# Patient Record
Sex: Female | Born: 1970 | Race: White | Hispanic: No | Smoking: Current every day smoker
Health system: Southern US, Community
[De-identification: ages and names within clinical notes are randomized; demographics above are authoritative.]

## PROBLEM LIST (undated history)

## (undated) DIAGNOSIS — J449 Chronic obstructive pulmonary disease, unspecified: Secondary | ICD-10-CM

## (undated) DIAGNOSIS — C801 Malignant (primary) neoplasm, unspecified: Secondary | ICD-10-CM

## (undated) DIAGNOSIS — F411 Generalized anxiety disorder: Secondary | ICD-10-CM

## (undated) HISTORY — PX: HERNIA REPAIR: SHX51

## (undated) HISTORY — DX: Generalized anxiety disorder: F41.1

## (undated) HISTORY — DX: Chronic obstructive pulmonary disease, unspecified: J44.9

## (undated) HISTORY — PX: BACK SURGERY: SHX140

## (undated) HISTORY — PX: TUBAL LIGATION: SHX77

---

## 2000-08-02 ENCOUNTER — Inpatient Hospital Stay (HOSPITAL_COMMUNITY): Admission: AD | Admit: 2000-08-02 | Discharge: 2000-08-02 | Payer: Self-pay | Admitting: Obstetrics

## 2000-08-02 ENCOUNTER — Encounter: Payer: Self-pay | Admitting: Obstetrics

## 2000-08-04 ENCOUNTER — Encounter: Admission: RE | Admit: 2000-08-04 | Discharge: 2000-08-04 | Payer: Self-pay | Admitting: Family Medicine

## 2000-08-05 ENCOUNTER — Inpatient Hospital Stay (HOSPITAL_COMMUNITY): Admission: AD | Admit: 2000-08-05 | Discharge: 2000-08-05 | Payer: Self-pay | Admitting: Obstetrics & Gynecology

## 2000-08-18 ENCOUNTER — Encounter: Admission: RE | Admit: 2000-08-18 | Discharge: 2000-08-18 | Payer: Self-pay | Admitting: Family Medicine

## 2000-09-01 ENCOUNTER — Encounter: Admission: RE | Admit: 2000-09-01 | Discharge: 2000-09-01 | Payer: Self-pay | Admitting: Family Medicine

## 2000-09-29 ENCOUNTER — Encounter: Admission: RE | Admit: 2000-09-29 | Discharge: 2000-09-29 | Payer: Self-pay | Admitting: Family Medicine

## 2000-10-20 ENCOUNTER — Encounter: Admission: RE | Admit: 2000-10-20 | Discharge: 2000-10-20 | Payer: Self-pay | Admitting: Family Medicine

## 2000-10-28 ENCOUNTER — Encounter: Admission: RE | Admit: 2000-10-28 | Discharge: 2000-10-28 | Payer: Self-pay | Admitting: Family Medicine

## 2000-11-02 ENCOUNTER — Ambulatory Visit (HOSPITAL_COMMUNITY): Admission: RE | Admit: 2000-11-02 | Discharge: 2000-11-02 | Payer: Self-pay | Admitting: Family Medicine

## 2000-11-17 ENCOUNTER — Encounter: Admission: RE | Admit: 2000-11-17 | Discharge: 2000-11-17 | Payer: Self-pay | Admitting: Family Medicine

## 2000-11-22 ENCOUNTER — Encounter: Admission: RE | Admit: 2000-11-22 | Discharge: 2000-11-22 | Payer: Self-pay | Admitting: Family Medicine

## 2000-12-07 ENCOUNTER — Encounter: Admission: RE | Admit: 2000-12-07 | Discharge: 2000-12-07 | Payer: Self-pay | Admitting: Family Medicine

## 2000-12-07 ENCOUNTER — Inpatient Hospital Stay (HOSPITAL_COMMUNITY): Admission: AD | Admit: 2000-12-07 | Discharge: 2000-12-11 | Payer: Self-pay | Admitting: *Deleted

## 2000-12-07 ENCOUNTER — Encounter: Payer: Self-pay | Admitting: *Deleted

## 2000-12-15 ENCOUNTER — Encounter: Admission: RE | Admit: 2000-12-15 | Discharge: 2000-12-15 | Payer: Self-pay | Admitting: Family Medicine

## 2000-12-27 ENCOUNTER — Encounter: Admission: RE | Admit: 2000-12-27 | Discharge: 2000-12-27 | Payer: Self-pay | Admitting: Family Medicine

## 2001-01-07 ENCOUNTER — Encounter: Admission: RE | Admit: 2001-01-07 | Discharge: 2001-01-07 | Payer: Self-pay | Admitting: Family Medicine

## 2001-01-07 ENCOUNTER — Inpatient Hospital Stay (HOSPITAL_COMMUNITY): Admission: AD | Admit: 2001-01-07 | Discharge: 2001-01-07 | Payer: Self-pay | Admitting: *Deleted

## 2001-01-07 ENCOUNTER — Encounter: Payer: Self-pay | Admitting: *Deleted

## 2001-01-14 ENCOUNTER — Encounter: Admission: RE | Admit: 2001-01-14 | Discharge: 2001-01-14 | Payer: Self-pay | Admitting: Family Medicine

## 2001-01-19 ENCOUNTER — Encounter: Admission: RE | Admit: 2001-01-19 | Discharge: 2001-01-19 | Payer: Self-pay | Admitting: Family Medicine

## 2001-01-31 ENCOUNTER — Encounter: Admission: RE | Admit: 2001-01-31 | Discharge: 2001-01-31 | Payer: Self-pay | Admitting: Pediatrics

## 2001-02-10 ENCOUNTER — Encounter: Admission: RE | Admit: 2001-02-10 | Discharge: 2001-02-10 | Payer: Self-pay | Admitting: Family Medicine

## 2001-02-10 ENCOUNTER — Encounter: Payer: Self-pay | Admitting: Obstetrics & Gynecology

## 2001-02-10 ENCOUNTER — Inpatient Hospital Stay (HOSPITAL_COMMUNITY): Admission: AD | Admit: 2001-02-10 | Discharge: 2001-02-10 | Payer: Self-pay | Admitting: Obstetrics & Gynecology

## 2001-02-15 ENCOUNTER — Encounter: Admission: RE | Admit: 2001-02-15 | Discharge: 2001-02-15 | Payer: Self-pay | Admitting: Family Medicine

## 2001-02-22 ENCOUNTER — Encounter: Admission: RE | Admit: 2001-02-22 | Discharge: 2001-02-22 | Payer: Self-pay | Admitting: Family Medicine

## 2001-03-01 ENCOUNTER — Encounter: Admission: RE | Admit: 2001-03-01 | Discharge: 2001-03-01 | Payer: Self-pay | Admitting: Family Medicine

## 2001-03-08 ENCOUNTER — Inpatient Hospital Stay (HOSPITAL_COMMUNITY): Admission: AD | Admit: 2001-03-08 | Discharge: 2001-03-08 | Payer: Self-pay | Admitting: *Deleted

## 2001-03-10 ENCOUNTER — Inpatient Hospital Stay (HOSPITAL_COMMUNITY): Admission: AD | Admit: 2001-03-10 | Discharge: 2001-03-14 | Payer: Self-pay | Admitting: Obstetrics & Gynecology

## 2001-04-19 ENCOUNTER — Inpatient Hospital Stay (HOSPITAL_COMMUNITY): Admission: AD | Admit: 2001-04-19 | Discharge: 2001-04-19 | Payer: Self-pay | Admitting: *Deleted

## 2001-04-25 ENCOUNTER — Ambulatory Visit (HOSPITAL_COMMUNITY): Admission: RE | Admit: 2001-04-25 | Discharge: 2001-04-25 | Payer: Self-pay | Admitting: Obstetrics & Gynecology

## 2001-05-01 ENCOUNTER — Inpatient Hospital Stay (HOSPITAL_COMMUNITY): Admission: AD | Admit: 2001-05-01 | Discharge: 2001-05-01 | Payer: Self-pay | Admitting: Obstetrics & Gynecology

## 2005-04-28 ENCOUNTER — Other Ambulatory Visit: Admission: RE | Admit: 2005-04-28 | Discharge: 2005-04-28 | Payer: Self-pay | Admitting: Family Medicine

## 2006-10-18 ENCOUNTER — Encounter: Admission: RE | Admit: 2006-10-18 | Discharge: 2006-10-18 | Payer: Self-pay | Admitting: *Deleted

## 2011-05-29 ENCOUNTER — Other Ambulatory Visit: Payer: Self-pay | Admitting: Internal Medicine

## 2011-06-01 ENCOUNTER — Other Ambulatory Visit: Payer: Self-pay | Admitting: Internal Medicine

## 2012-11-12 ENCOUNTER — Other Ambulatory Visit: Payer: Self-pay | Admitting: Nurse Practitioner

## 2012-11-14 NOTE — Telephone Encounter (Signed)
Rx sent to pharmacy   

## 2012-11-14 NOTE — Telephone Encounter (Signed)
MMM SENT RX TO PHARMACY

## 2012-11-14 NOTE — Telephone Encounter (Signed)
PLEASE ADVISE.

## 2012-11-15 ENCOUNTER — Telehealth: Payer: Self-pay | Admitting: Nurse Practitioner

## 2012-11-22 NOTE — Telephone Encounter (Signed)
lmtcb on 4/17- pt never returned call- pt may call if anything/appt still needed.

## 2012-12-06 ENCOUNTER — Telehealth: Payer: Self-pay | Admitting: Nurse Practitioner

## 2012-12-06 NOTE — Telephone Encounter (Signed)
Nursing pe scheduled for 5/29 with mmm

## 2012-12-29 ENCOUNTER — Encounter: Payer: Self-pay | Admitting: Nurse Practitioner

## 2012-12-29 ENCOUNTER — Ambulatory Visit (INDEPENDENT_AMBULATORY_CARE_PROVIDER_SITE_OTHER): Payer: PRIVATE HEALTH INSURANCE | Admitting: Nurse Practitioner

## 2012-12-29 VITALS — BP 113/79 | HR 89 | Temp 98.0°F | Ht 65.0 in | Wt 218.0 lb

## 2012-12-29 DIAGNOSIS — Z23 Encounter for immunization: Secondary | ICD-10-CM

## 2012-12-29 DIAGNOSIS — Z0000 Encounter for general adult medical examination without abnormal findings: Secondary | ICD-10-CM

## 2012-12-29 NOTE — Patient Instructions (Signed)

## 2012-12-29 NOTE — Progress Notes (Addendum)
  Subjective:    Patient ID: Lynn Golden, female    DOB: September 25, 1970, 42 y.o.   MRN: [id number]  HPI- Patient in today for a complete physical exam. 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 Her only medical problems are mild depression in which she takes lexapro for and COPD in which sh eis on symbicort and albuterol. She has decreased her amount of cigerette smoking to 5 a day. Unable to smoke at work which has really helped her cut down. She has no complaints today.    Review of Systems  Constitutional: Negative.   HENT: Negative.   Eyes: Negative.   Respiratory: Negative.   Cardiovascular: Negative.   Gastrointestinal: Negative.   Endocrine: Negative.   Genitourinary: Negative.   Musculoskeletal: Negative.   Allergic/Immunologic: Negative.   Neurological: Negative.   Hematological: Negative.   Psychiatric/Behavioral: Negative.        Objective:   Physical Exam  Constitutional: She is oriented to person, place, and time. She appears well-developed and well-nourished.  HENT:  Nose: Nose normal.  Mouth/Throat: Oropharynx is clear and moist.  Eyes: EOM are normal.  Neck: Trachea normal, normal range of motion and full passive range of motion without pain. Neck supple. No JVD present. Carotid bruit is not present. No thyromegaly present.  Cardiovascular: Normal rate, regular rhythm, normal heart sounds and intact distal pulses.  Exam reveals no gallop and no friction rub.   No murmur heard. Pulmonary/Chest: Effort normal and breath sounds normal.  Abdominal: Soft. Bowel sounds are normal. She exhibits no distension and no mass. There is no tenderness.  Musculoskeletal: Normal range of motion.  Lymphadenopathy:    She has no cervical adenopathy.  Neurological: She is alert and oriented to person, place, and time. She has normal reflexes.  Skin: Skin is warm and dry.  Psychiatric: She has a normal mood and affect. Her behavior is normal. Judgment and thought  content normal.    BP 113/79  Pulse 89  Temp(Src) 98 F (36.7 C) (Oral)  Ht 5\' 5"  (1.651 m)  Wt 218 lb (98.884 kg)  BMI 36.28 kg/m2  LMP 12/16/2012       Assessment & Plan:  1. Need for Tdap vaccination - Tdap vaccine greater than or equal to 7yo IM  2. Annual physical exam Low fat diet and exercise Labs pending - Measles/Mumps/Rubella Immunity - Varicella zoster antibody, IgG   Mary-Margaret Daphine Deutscher, FNP

## 2012-12-30 LAB — MEASLES/MUMPS/RUBELLA IMMUNITY
Mumps IgG: 45.9 AU/mL — ABNORMAL HIGH (ref <9.00)
Rubella: 10.7 Index — ABNORMAL HIGH (ref <0.90)

## 2012-12-30 NOTE — Progress Notes (Deleted)
  Subjective:    Patient ID: Lynn Golden, female    DOB: 01/19/1971, 42 y.o.   MRN: [id number]  HPI    Review of Systems     Objective:   Physical Exam        Assessment & Plan:

## 2013-01-03 ENCOUNTER — Telehealth: Payer: Self-pay | Admitting: Nurse Practitioner

## 2013-01-03 NOTE — Telephone Encounter (Signed)
Patient aware of labs.  

## 2013-02-22 ENCOUNTER — Other Ambulatory Visit: Payer: Self-pay | Admitting: General Practice

## 2013-02-22 ENCOUNTER — Ambulatory Visit (INDEPENDENT_AMBULATORY_CARE_PROVIDER_SITE_OTHER): Payer: PRIVATE HEALTH INSURANCE | Admitting: General Practice

## 2013-02-22 ENCOUNTER — Encounter: Payer: Self-pay | Admitting: General Practice

## 2013-02-22 VITALS — BP 124/87 | HR 99 | Temp 98.6°F | Ht 66.0 in | Wt 209.0 lb

## 2013-02-22 DIAGNOSIS — N6320 Unspecified lump in the left breast, unspecified quadrant: Secondary | ICD-10-CM

## 2013-02-22 DIAGNOSIS — N63 Unspecified lump in unspecified breast: Secondary | ICD-10-CM

## 2013-02-22 DIAGNOSIS — N6310 Unspecified lump in the right breast, unspecified quadrant: Secondary | ICD-10-CM

## 2013-02-22 NOTE — Patient Instructions (Signed)

## 2013-02-22 NOTE — Progress Notes (Signed)
  Subjective:    Patient ID: Lynn Golden, female    DOB: 02-12-1971, 42 y.o.   MRN: [id number]  HPI Patient presents today with complaints of left breast mass. She reports noticing lump on Friday night. She denies drainage from any area of breast. She reports area being slightly tender. She has a history of mid chest mass.     Review of Systems  Constitutional: Negative for fever and chills.  Respiratory: Negative for chest tightness and shortness of breath.   Cardiovascular: Negative for chest pain and palpitations.       Objective:   Physical Exam  Constitutional: She is oriented to person, place, and time. She appears well-developed and well-nourished.  HENT:  Head: Normocephalic and atraumatic.  Right Ear: External ear normal.  Left Ear: External ear normal.  Nose: Nose normal.  Mouth/Throat: Oropharynx is clear and moist.  Cardiovascular: Normal rate, regular rhythm, normal heart sounds and intact distal pulses.   Pulmonary/Chest: Effort normal and breath sounds normal. No respiratory distress. She exhibits no tenderness. Right breast exhibits no inverted nipple, no mass, no nipple discharge, no skin change and no tenderness. Left breast exhibits mass and tenderness. Left breast exhibits no inverted nipple, no nipple discharge and no skin change. Breasts are symmetrical.  Neurological: She is alert and oriented to person, place, and time.  Skin: Skin is warm and dry.  Psychiatric: She has a normal mood and affect.          Assessment & Plan:  1. Left breast mass - MM Digital Diagnostic Bilat; Future -RTO if symptoms worsen or seek medical attention  -Keep appointment for mammogram once scheduled -Patient verbalized understanding -Coralie Keens, FNP-C

## 2013-03-02 ENCOUNTER — Ambulatory Visit
Admission: RE | Admit: 2013-03-02 | Discharge: 2013-03-02 | Disposition: A | Discharge status: Home / Self Care | Payer: PRIVATE HEALTH INSURANCE | Source: Ambulatory Visit | Attending: General Practice | Admitting: General Practice

## 2013-03-02 ENCOUNTER — Other Ambulatory Visit: Payer: Self-pay | Admitting: General Practice

## 2013-03-02 DIAGNOSIS — N6310 Unspecified lump in the right breast, unspecified quadrant: Secondary | ICD-10-CM

## 2013-03-02 DIAGNOSIS — N6320 Unspecified lump in the left breast, unspecified quadrant: Secondary | ICD-10-CM

## 2013-05-31 ENCOUNTER — Telehealth: Payer: Self-pay | Admitting: Nurse Practitioner

## 2013-05-31 NOTE — Telephone Encounter (Signed)
appt given for tomorrow for back pain

## 2013-06-01 ENCOUNTER — Encounter: Payer: Self-pay | Admitting: Nurse Practitioner

## 2013-06-01 ENCOUNTER — Ambulatory Visit (INDEPENDENT_AMBULATORY_CARE_PROVIDER_SITE_OTHER): Payer: PRIVATE HEALTH INSURANCE | Admitting: Nurse Practitioner

## 2013-06-01 ENCOUNTER — Ambulatory Visit (INDEPENDENT_AMBULATORY_CARE_PROVIDER_SITE_OTHER): Payer: PRIVATE HEALTH INSURANCE

## 2013-06-01 ENCOUNTER — Ambulatory Visit (HOSPITAL_COMMUNITY)
Admission: RE | Admit: 2013-06-01 | Discharge: 2013-06-01 | Disposition: A | Discharge status: Home / Self Care | Payer: PRIVATE HEALTH INSURANCE | Source: Ambulatory Visit | Attending: Nurse Practitioner | Admitting: Nurse Practitioner

## 2013-06-01 VITALS — BP 129/85 | HR 121 | Temp 98.1°F | Ht 66.0 in | Wt 210.0 lb

## 2013-06-01 DIAGNOSIS — F329 Major depressive disorder, single episode, unspecified: Secondary | ICD-10-CM | POA: Insufficient documentation

## 2013-06-01 DIAGNOSIS — N949 Unspecified condition associated with female genital organs and menstrual cycle: Secondary | ICD-10-CM | POA: Insufficient documentation

## 2013-06-01 DIAGNOSIS — IMO0001 Reserved for inherently not codable concepts without codable children: Secondary | ICD-10-CM | POA: Insufficient documentation

## 2013-06-01 DIAGNOSIS — J209 Acute bronchitis, unspecified: Secondary | ICD-10-CM | POA: Insufficient documentation

## 2013-06-01 DIAGNOSIS — R1032 Left lower quadrant pain: Secondary | ICD-10-CM

## 2013-06-01 DIAGNOSIS — M51379 Other intervertebral disc degeneration, lumbosacral region without mention of lumbar back pain or lower extremity pain: Secondary | ICD-10-CM | POA: Insufficient documentation

## 2013-06-01 DIAGNOSIS — D259 Leiomyoma of uterus, unspecified: Secondary | ICD-10-CM | POA: Insufficient documentation

## 2013-06-01 DIAGNOSIS — R3989 Other symptoms and signs involving the genitourinary system: Secondary | ICD-10-CM

## 2013-06-01 DIAGNOSIS — M5137 Other intervertebral disc degeneration, lumbosacral region: Secondary | ICD-10-CM | POA: Insufficient documentation

## 2013-06-01 DIAGNOSIS — R82998 Other abnormal findings in urine: Secondary | ICD-10-CM

## 2013-06-01 LAB — POCT CBC
Granulocyte percent: 62.9 %G (ref 37–80)
Lymph, poc: 1.5 (ref 0.6–3.4)
MCH, POC: 25.2 pg — AB (ref 27–31.2)
MCV: 80.6 fL (ref 80–97)
MPV: 7.4 fL (ref 0–99.8)
POC LYMPH PERCENT: 27 %L (ref 10–50)
Platelet Count, POC: 390 10*3/uL (ref 142–424)
RDW, POC: 17 %
WBC: 5.5 10*3/uL (ref 4.6–10.2)

## 2013-06-01 LAB — POCT URINALYSIS DIPSTICK
Bilirubin, UA: NEGATIVE
Blood, UA: NEGATIVE
Glucose, UA: NEGATIVE
Spec Grav, UA: 1.025
Urobilinogen, UA: NEGATIVE

## 2013-06-01 LAB — POCT UA - MICROSCOPIC ONLY: Casts, Ur, LPF, POC: NEGATIVE

## 2013-06-01 NOTE — Progress Notes (Signed)
Subjective:    Patient ID: Lynn Golden, female    DOB: [birth year], 42 y.o.   MRN: [id number]  HPI patient in c/o left lower quadrant pain- started 3 days ago- has actually gotten worse.    Review of Systems  Constitutional: Negative for fever and chills.  Respiratory: Negative.   Cardiovascular: Negative.   Gastrointestinal: Positive for abdominal pain. Negative for diarrhea and constipation.  Genitourinary: Positive for urgency, frequency and pelvic pain.  Hematological: Negative.   Psychiatric/Behavioral: Negative.        Objective:   Physical Exam  Constitutional: She appears well-developed and well-nourished.  Cardiovascular: Normal rate, regular rhythm and normal heart sounds.   Pulmonary/Chest: Effort normal and breath sounds normal.  Abdominal: Soft. Bowel sounds are normal. She exhibits no mass. There is tenderness (left lower quadrant and groin area). There is no rebound and no guarding.  Genitourinary:  No CVA tenderness Left pelvic pain on bimanual exam- no masses palpable    BP 129/85  Pulse 121  Temp(Src) 98.1 F (36.7 C) (Oral)  Ht 5\' 6"  (1.676 m)  Wt 210 lb (95.255 kg)  BMI 33.91 kg/m2  KUB- gas- but otherwise negative-Preliminary reading by Paulene Floor, FNP  [facility] Results for orders placed in visit on 06/01/13  POCT UA - MICROSCOPIC ONLY      Result Value Range   WBC, Ur, HPF, POC occ     RBC, urine, microscopic 3-7     Bacteria, U Microscopic few     Mucus, UA mod     Epithelial cells, urine per micros few     Crystals, Ur, HPF, POC neg     Casts, Ur, LPF, POC neg     Yeast, UA neg    POCT URINALYSIS DIPSTICK      Result Value Range   Color, UA yellow     Clarity, UA clear     Glucose, UA neg     Bilirubin, UA neg     Ketones, UA neg     Spec Grav, UA 1.025     Blood, UA neg     pH, UA 5.0     Protein, UA small     Urobilinogen, UA negative     Nitrite, UA neg     Leukocytes, UA Negative    POCT CBC      Result Value Range   WBC  5.5  4.6 - 10.2 K/uL   Lymph, poc 1.5  0.6 - 3.4   POC LYMPH PERCENT 27.0  10 - 50 %L   POC Granulocyte 3.5  2 - 6.9   Granulocyte percent 62.9  37 - 80 %G   RBC 4.6  4.04 - 5.48 M/uL   Hemoglobin 11.4 (*) 12.2 - 16.2 g/dL   HCT, POC 65.7 (*) 84.6 - 47.9 %   MCV 80.6  80 - 97 fL   MCH, POC 25.2 (*) 27 - 31.2 pg   MCHC 31.2 (*) 31.8 - 35.4 g/dL   RDW, POC 96.2     Platelet Count, POC 390.0  142 - 424 K/uL   MPV 7.4  0 - 99.8 fL       Assessment & Plan:   1. Abnormal urine color   2. Abdominal pain, left lower quadrant    Orders Placed This Encounter  Procedures  . DG Abd 1 View    Standing Status: Future     Number of Occurrences: 1     Standing Expiration Date: 08/01/2014  Order Specific Question:  Reason for Exam (SYMPTOM  OR DIAGNOSIS REQUIRED)    Answer:  abdmonial pain    Order Specific Question:  Is the patient pregnant?    Answer:  No    Order Specific Question:  Preferred imaging location?    Answer:  Internal  . CT Abdomen Pelvis Wo Contrast    Standing Status: Future     Number of Occurrences:      Standing Expiration Date: 09/01/2014    Order Specific Question:  Reason for Exam (SYMPTOM  OR DIAGNOSIS REQUIRED)    Answer:  left pelvic pain    Order Specific Question:  Is the patient pregnant?    Answer:  No    Order Specific Question:  Preferred imaging location?    Answer:  [facility]  . POCT UA - Microscopic Only  . POCT urinalysis dipstick  . POCT CBC   NPO until has ct scan Will talk after ct results are back Mary-Margaret Daphine Deutscher, FNP

## 2013-06-01 NOTE — Patient Instructions (Signed)
Pelvic Pain Pelvic pain is pain below the belly button and located between your hips. Acute pain may last a few hours or days. Chronic pelvic pain may last weeks and months. The cause may be different for different types of pain. The pain may be dull or sharp, mild or severe and can interfere with your daily activities. Write down and tell your caregiver:   Exactly where the pain is located.  If it comes and goes or is there all the time.  When it happens (with sex, urination, bowel movement, etc.)  If the pain is related to your menstrual period or stress. Your caregiver will take a full history and do a complete physical exam and Pap test. CAUSES   Painful menstrual periods (dysmenorrhea).  Normal ovulation (Mittelschmertz) that occurs in the middle of the menstrual cycle every month.  The pelvic organs get engorged with blood just before the menstrual period (pelvic congestive syndrome).  Scar tissue from an infection or past surgery (pelvic adhesions).  Cancer of the female pelvic organs. When there is pain with cancer, it has been there for a long time.  The lining of the uterus (endometrium) abnormally grows in places like the pelvis and on the pelvic organs (endometriosis).  A form of endometriosis with the lining of the uterus present inside of the muscle tissue of the uterus (adenomyosis).  Fibroid tumor (noncancerous) in the uterus.  Bladder problems such as infection, bladder spasms of the muscle tissue of the bladder.  Intestinal problems (irritable bowel syndrome, colitis, an ulcer or gastrointestinal infection).  Polyps of the cervix or uterus.  Pregnancy in the tube (ectopic pregnancy).  The opening of the cervix is too small for the menstrual blood to flow through it (cervical stenosis).  Physical or sexual abuse (past or present).  Musculo-skeletal problems from poor posture, problems with the vertebrae of the lower back or the uterine pelvic muscles falling  (prolapse).  Psychological problems such as depression or stress.  IUD (intrauterine device) in the uterus. DIAGNOSIS  Tests to make a diagnosis depends on the type, location, severity and what causes the pain to occur. Tests that may be needed include:  Blood tests.  Urine tests  Ultrasound.  X-rays.  CT Scan.  MRI.  Laparoscopy.  Major surgery. TREATMENT  Treatment will depend on the cause of the pain, which includes:  Prescription or over-the-counter pain medication.  Antibiotics.  Birth control pills.  Hormone treatment.  Nerve blocking injections.  Physical therapy.  Antidepressants.  Counseling with a psychiatrist or psychologist.  Minor or major surgery. HOME CARE INSTRUCTIONS   Only take over-the-counter or prescription medicines for pain, discomfort or fever as directed by your caregiver.  Follow your caregiver's advice to treat your pain.  Rest.  Avoid sexual intercourse if it causes the pain.  Apply warm or cold compresses (which ever works best) to the pain area.  Do relaxation exercises such as yoga or meditation.  Try acupuncture.  Avoid stressful situations.  Try group therapy.  If the pain is because of a stomach/intestinal upset, drink clear liquids, eat a bland light food diet until the symptoms go away. SEEK MEDICAL CARE IF:   You need stronger prescription pain medication.  You develop pain with sexual intercourse.  You have pain with urination.  You develop a temperature of 102 F (38.9 C) with the pain.  You are still in pain after 4 hours of taking prescription medication for the pain.  You need depression medication.    Your IUD is causing pain and you want it removed. SEEK IMMEDIATE MEDICAL CARE IF:  You develop very severe pain or tenderness.  You faint, have chills, severe weakness or dehydration.  You develop heavy vaginal bleeding or passing solid tissue.  You develop a temperature of 102 F (38.9 C)  with the pain.  You have blood in the urine.  You are being physically or sexually abused.  You have uncontrolled vomiting and diarrhea.  You are depressed and afraid of harming yourself or someone else. Document Released: 08/27/2004 Document Revised: 10/12/2011 Document Reviewed: 05/24/2008 ExitCare Patient Information 2013 [employer].  

## 2013-06-02 ENCOUNTER — Telehealth: Payer: Self-pay | Admitting: Nurse Practitioner

## 2013-06-05 ENCOUNTER — Other Ambulatory Visit: Payer: Self-pay | Admitting: Nurse Practitioner

## 2013-06-05 DIAGNOSIS — R1032 Left lower quadrant pain: Secondary | ICD-10-CM

## 2013-06-05 NOTE — Telephone Encounter (Signed)
I didn't get results until today- completely negative- no masses seen only uterine fibroids which should not cause that much pain- let do GI referal

## 2013-06-08 ENCOUNTER — Other Ambulatory Visit: Payer: Self-pay

## 2013-07-24 ENCOUNTER — Encounter: Payer: Self-pay | Admitting: Family Medicine

## 2013-08-14 ENCOUNTER — Telehealth: Payer: Self-pay | Admitting: Nurse Practitioner

## 2013-08-14 MED ORDER — ALBUTEROL SULFATE HFA 108 (90 BASE) MCG/ACT IN AERS
2.0000 | INHALATION_SPRAY | Freq: Four times a day (QID) | RESPIRATORY_TRACT | Status: DC | PRN
Start: 2013-08-14 — End: 2013-08-15

## 2013-08-14 NOTE — Telephone Encounter (Signed)
rx sent to pharmacy

## 2013-08-15 ENCOUNTER — Other Ambulatory Visit: Payer: Self-pay | Admitting: Nurse Practitioner

## 2013-08-15 MED ORDER — ALBUTEROL SULFATE HFA 108 (90 BASE) MCG/ACT IN AERS: 2.0000 | INHALATION_SPRAY | Freq: Four times a day (QID) | RESPIRATORY_TRACT | Status: DC | PRN

## 2014-02-21 IMAGING — CT CT ABD-PELV W/O CM
2 of 3 series · 9 of 46 positions shown, 11 images · non-contrast
Comparison: None.

CLINICAL DATA: Left lower quadrant abdominal pain. Pelvic pain.

EXAM:
CT ABDOMEN AND PELVIS WITHOUT CONTRAST
TECHNIQUE: Multidetector CT imaging of the abdomen and pelvis was performed
following the standard protocol without intravenous contrast.

[Series 4: mpr coronal (id) · coronal · 0.79mm/px · 8 of 97 slices shown, 9 images]
[im 11/97  soft-tissue]
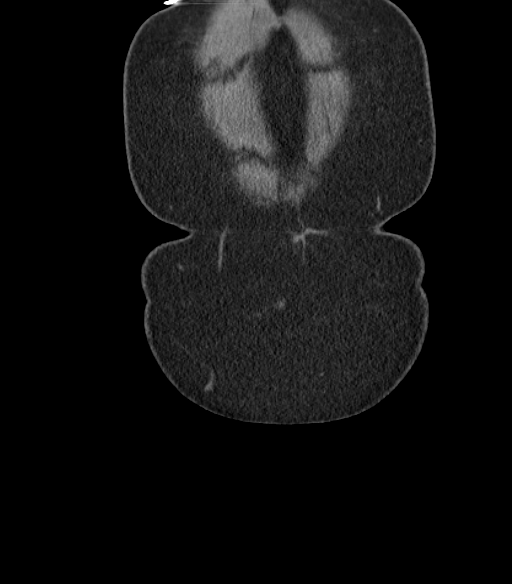
[im 11/97  bone]
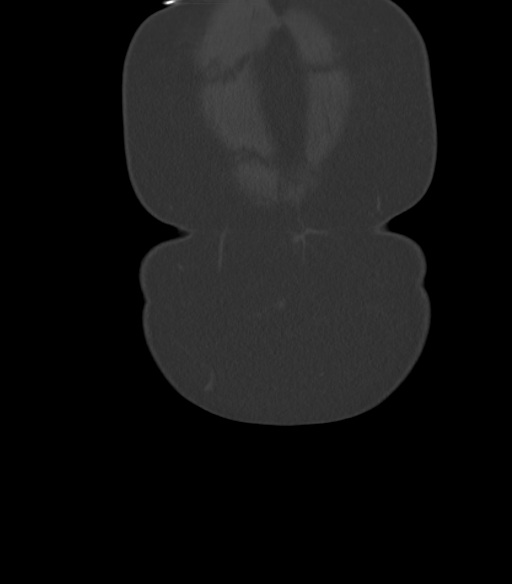
[im 22/97  soft-tissue]
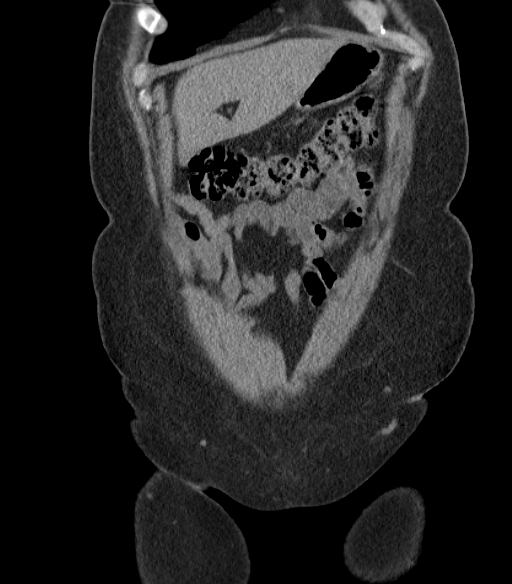
[im 33/97  soft-tissue]
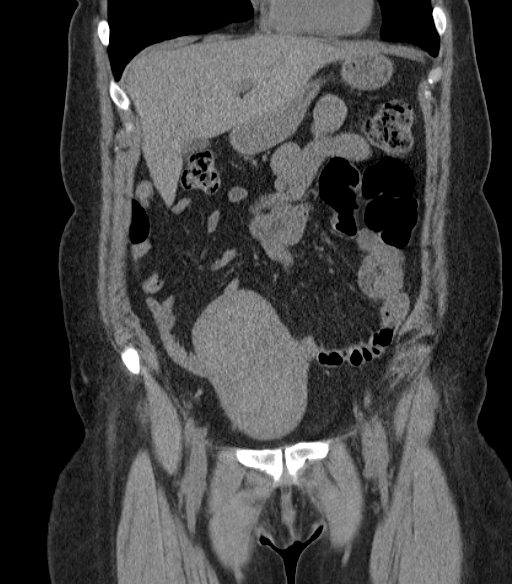
[im 43/97  soft-tissue]
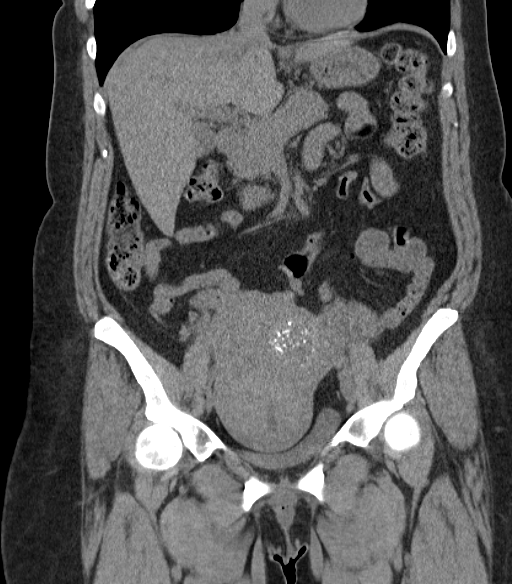
[im 54/97  soft-tissue]
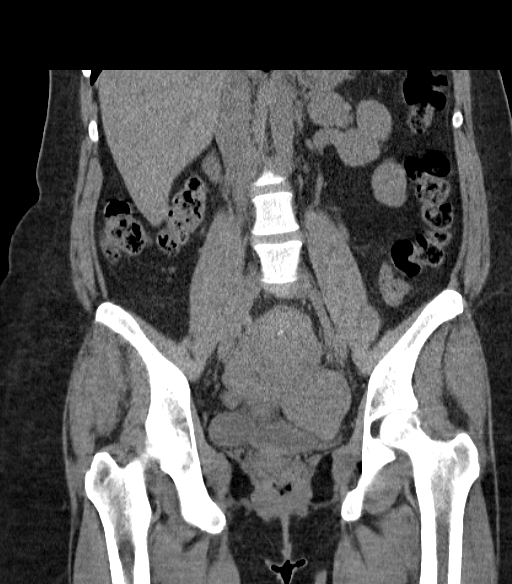
[im 65/97  soft-tissue]
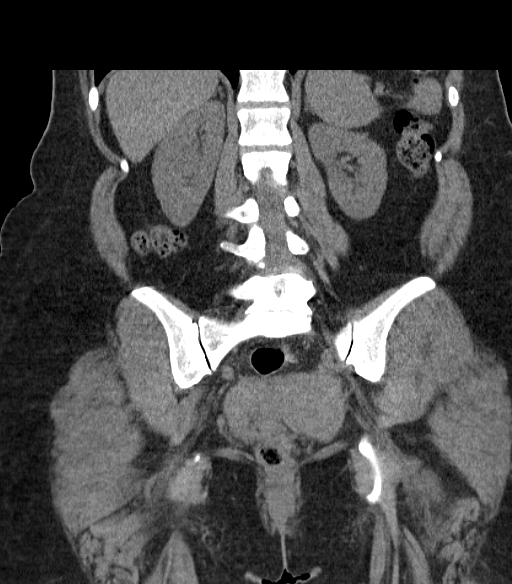
[im 75/97  soft-tissue]
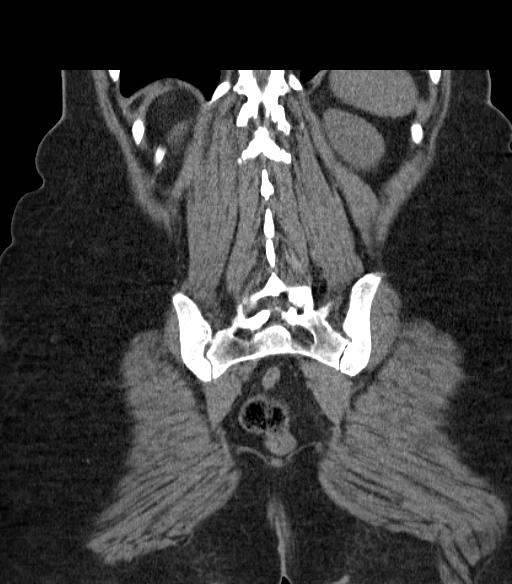
[im 86/97  soft-tissue]
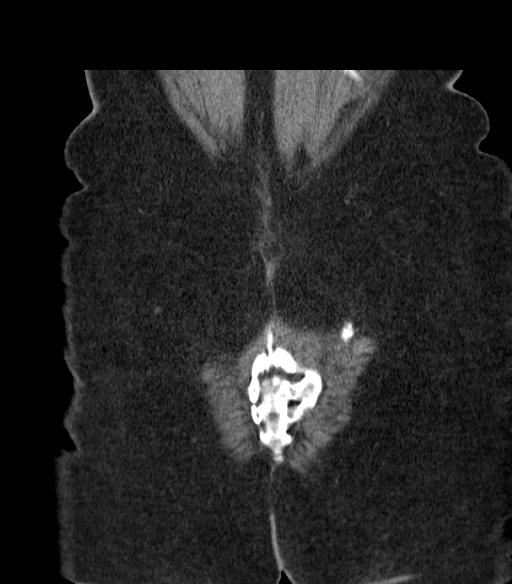

[Series 5: mpr sagittal (id) · sagittal · 0.62mm/px · 1 of 136 slices shown, 2 images]
[im 46/136  soft-tissue]
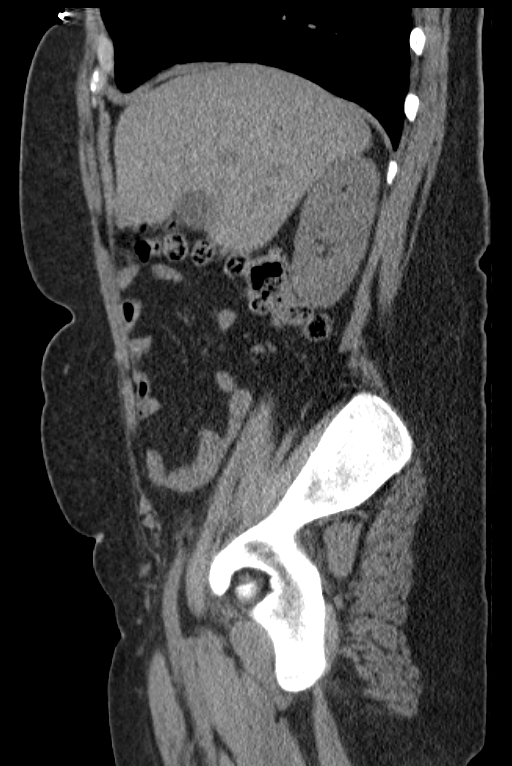
[im 46/136  bone]
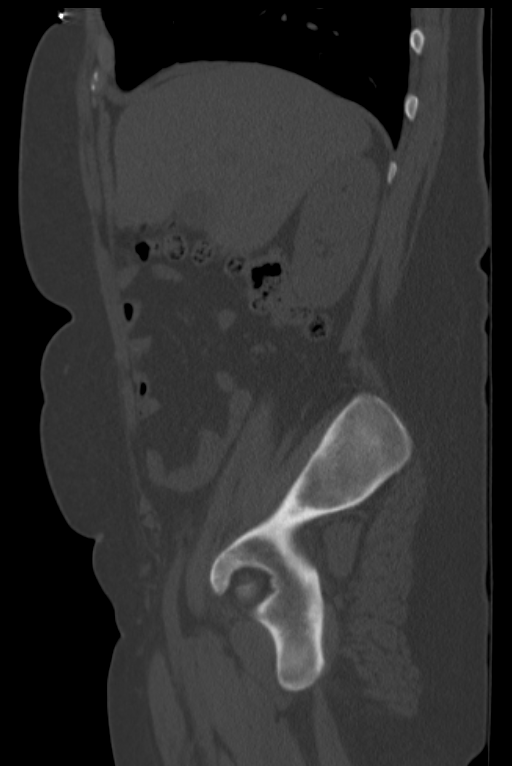

[9 of 46 positions shown; findings below may reference images not displayed]

FINDINGS: Lower Chest: Clear lung bases. Normal heart size without pericardial
or pleural effusion. A tiny hiatal hernia.

Abdomen/Pelvis: Normal an unfused appearance of the liver, spleen,
distal stomach, pancreas, gallbladder, biliary tract, adrenal
glands.

No renal calculi or hydronephrosis. No hydroureter or ureteric
calculi. Retroaortic left renal vein. No retroperitoneal or
retrocrural adenopathy.

Normal terminal ileum and appendix. Normal small bowel without
abdominal ascites. No pelvic adenopathy. Normal urinary bladder.
Multiple uterine fibroids. An anterior centrally necrotic fundal
fibroid measures 7.6 cm on image 69. Eccentric left lower uterine
segment 6.0 x 6.7 cm fibroid on image 67. Limited evaluation of the
adnexa, without definite mass. No significant free fluid.

Bones/Musculoskeletal: No acute osseous abnormality. Advanced
degenerative disc disease at the lumbosacral junction.
IMPRESSION: 1.  No urinary tract calculi or hydronephrosis.
2. Fibroid uterus.
3. No other explanation for patient's symptoms.

## 2014-03-16 ENCOUNTER — Encounter: Payer: Self-pay | Admitting: Nurse Practitioner

## 2014-03-16 ENCOUNTER — Ambulatory Visit (INDEPENDENT_AMBULATORY_CARE_PROVIDER_SITE_OTHER): Payer: PRIVATE HEALTH INSURANCE | Admitting: Nurse Practitioner

## 2014-03-16 VITALS — BP 132/83 | HR 124 | Temp 98.6°F | Ht 66.0 in | Wt 223.0 lb

## 2014-03-16 DIAGNOSIS — F411 Generalized anxiety disorder: Secondary | ICD-10-CM

## 2014-03-16 NOTE — Patient Instructions (Signed)
Stress and Stress Management Stress is a normal reaction to life events. It is what you feel when life demands more than you are used to or more than you can handle. Some stress can be useful. For example, the stress reaction can help you catch the last bus of the day, study for a test, or meet a deadline at work. But stress that occurs too often or for too long can cause problems. It can affect your emotional health and interfere with relationships and normal daily activities. Too much stress can weaken your immune system and increase your risk for physical illness. If you already have a medical problem, stress can make it worse. CAUSES  All sorts of life events may cause stress. An event that causes stress for one person may not be stressful for another person. Major life events commonly cause stress. These may be positive or negative. Examples include losing your job, moving into a new home, getting married, having a baby, or losing a loved one. Less obvious life events may also cause stress, especially if they occur day after day or in combination. Examples include working long hours, driving in traffic, caring for children, being in debt, or being in a difficult relationship. SIGNS AND SYMPTOMS Stress may cause emotional symptoms including, the following:  Anxiety. This is feeling worried, afraid, on edge, overwhelmed, or out of control.  Anger. This is feeling irritated or impatient.  Depression. This is feeling sad, down, helpless, or guilty.  Difficulty focusing, remembering, or making decisions. Stress may cause physical symptoms, including the following:   Aches and pains. These may affect your head, neck, back, stomach, or other areas of your body.  Tight muscles or clenched jaw.  Low energy or trouble sleeping. Stress may cause unhealthy behaviors, including the following:   Eating to feel better (overeating) or skipping meals.  Sleeping too little, too much, or both.  Working  too much or putting off tasks (procrastination).  Smoking, drinking alcohol, or using drugs to feel better. DIAGNOSIS  Stress is diagnosed through an assessment by your health care provider. Your health care provider will ask questions about your symptoms and any stressful life events.Your health care provider will also ask about your medical history and may order blood tests or other tests. Certain medical conditions and medicine can cause physical symptoms similar to stress. Mental illness can cause emotional symptoms and unhealthy behaviors similar to stress. Your health care provider may refer you to a mental health professional for further evaluation.  TREATMENT  Stress management is the recommended treatment for stress.The goals of stress management are reducing stressful life events and coping with stress in healthy ways.  Techniques for reducing stressful life events include the following:  Stress identification. Self-monitor for stress and identify what causes stress for you. These skills may help you to avoid some stressful events.  Time management. Set your priorities, keep a calendar of events, and learn to say "no." These tools can help you avoid making too many commitments. Techniques for coping with stress include the following:  Rethinking the problem. Try to think realistically about stressful events rather than ignoring them or overreacting. Try to find the positives in a stressful situation rather than focusing on the negatives.  Exercise. Physical exercise can release both physical and emotional tension. The key is to find a form of exercise you enjoy and do it regularly.  Relaxation techniques. These relax the body and mind. Examples include yoga, meditation, tai chi, biofeedback, deep  breathing, progressive muscle relaxation, listening to music, being out in nature, journaling, and other hobbies. Again, the key is to find one or more that you enjoy and can do  regularly.  Healthy lifestyle. Eat a balanced diet, get plenty of sleep, and do not smoke. Avoid using alcohol or drugs to relax.  Strong support network. Spend time with family, friends, or other people you enjoy being around.Express your feelings and talk things over with someone you trust. Counseling or talktherapy with a mental health professional may be helpful if you are having difficulty managing stress on your own. Medicine is typically not recommended for the treatment of stress.Talk to your health care provider if you think you need medicine for symptoms of stress. HOME CARE INSTRUCTIONS  Keep all follow-up visits as directed by your health care provider.  Take all medicines as directed by your health care provider. SEEK MEDICAL CARE IF:  Your symptoms get worse or you start having new symptoms.  You feel overwhelmed by your problems and can no longer manage them on your own. SEEK IMMEDIATE MEDICAL CARE IF:  You feel like hurting yourself or someone else. Document Released: 01/13/2001 Document Revised: 12/04/2013 Document Reviewed: 03/14/2013 ExitCare Patient Information 2015 [employer]. This information is not intended to replace advice given to you by your health care provider. Make sure you discuss any questions you have with your health care provider.  

## 2014-03-16 NOTE — Addendum Note (Signed)
Addended by: Zannie Cove on: 03/16/2014 04:35 PM   Modules accepted: Level of Service

## 2014-03-16 NOTE — Progress Notes (Signed)
   Subjective:    Patient ID: Lynn Golden, female    DOB: September 13, 1970, 43 y.o.   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 She also has been working shifts ad has to double back frequently. Patient does nit want to be on any meds- she thinks it is all stress related.    Review of Systems  Constitutional: Negative.   HENT: Negative.   Respiratory: Negative.   Cardiovascular: Negative.   Gastrointestinal: Negative.   Genitourinary: Negative.   Neurological: Negative.   Psychiatric/Behavioral: Negative.   All other systems reviewed and are negative.      Objective:   Physical Exam  Constitutional: She is oriented to person, place, and time. She appears well-developed and well-nourished.  Cardiovascular: Normal rate, regular rhythm and normal heart sounds.   Pulmonary/Chest: Effort normal and breath sounds normal.  Neurological: She is alert and oriented to person, place, and time.  Skin: Skin is warm and dry.  Psychiatric: She has a normal mood and affect. Her behavior is normal. Judgment and thought content normal.   BP 132/83  Pulse 124  Temp(Src) 98.6 F (37 C) (Oral)  Ht 5\' 6"  (1.676 m)  Wt 223 lb (101.152 kg)  BMI 36.01 kg/m2  LMP 03/10/2014         Assessment & Plan:   1. GAD (generalized anxiety disorder)    Stress management Patient does not want any meds yet Out of work till Monday RTO prn  Mary-Margaret Hassell Done, [place]

## 2014-03-24 ENCOUNTER — Ambulatory Visit (INDEPENDENT_AMBULATORY_CARE_PROVIDER_SITE_OTHER): Payer: Self-pay | Admitting: Family Medicine

## 2014-03-24 ENCOUNTER — Encounter: Payer: Self-pay | Admitting: Family Medicine

## 2014-03-24 ENCOUNTER — Encounter: Payer: Self-pay | Admitting: *Deleted

## 2014-03-24 VITALS — BP 130/84 | HR 93 | Temp 98.8°F | Ht 66.0 in | Wt 224.0 lb

## 2014-03-24 DIAGNOSIS — R05 Cough: Secondary | ICD-10-CM

## 2014-03-24 DIAGNOSIS — J4521 Mild intermittent asthma with (acute) exacerbation: Secondary | ICD-10-CM

## 2014-03-24 DIAGNOSIS — J45901 Unspecified asthma with (acute) exacerbation: Secondary | ICD-10-CM

## 2014-03-24 DIAGNOSIS — J019 Acute sinusitis, unspecified: Secondary | ICD-10-CM

## 2014-03-24 DIAGNOSIS — R062 Wheezing: Secondary | ICD-10-CM

## 2014-03-24 DIAGNOSIS — R059 Cough, unspecified: Secondary | ICD-10-CM

## 2014-03-24 MED ORDER — METHYLPREDNISOLONE ACETATE 80 MG/ML IJ SUSP
60.0000 mg | Freq: Once | INTRAMUSCULAR | Status: AC
  Administered 2014-03-24: 60 mg via INTRAMUSCULAR

## 2014-03-24 MED ORDER — AMOXICILLIN 500 MG PO CAPS: 500.0000 mg | ORAL_CAPSULE | Freq: Three times a day (TID) | ORAL | Status: DC

## 2014-03-24 MED ORDER — PREDNISONE 10 MG PO TABS: ORAL_TABLET | ORAL | Status: DC

## 2014-03-24 MED ORDER — ALBUTEROL SULFATE HFA 108 (90 BASE) MCG/ACT IN AERS: 2.0000 | INHALATION_SPRAY | Freq: Four times a day (QID) | RESPIRATORY_TRACT | Status: DC | PRN

## 2014-03-24 NOTE — Progress Notes (Signed)
Subjective:    Patient ID: Lynn Golden, female    DOB: 12-09-1970, 43 y.o.   MRN: [id number]  HPI Patient here today for chest and head congestion and cough that started Wednesday night. The patient has had a low-grade fever. The sputum and drainage so far is clear. She indicates that prior to this starting that she did mow the yard. She has had a lot of sinus congestion and headache.        Patient Active Problem List   Diagnosis Date Noted  . Depression 06/01/2013  . COPD bronchitis 06/01/2013   Outpatient Encounter Prescriptions as of 03/24/2014  Medication Sig  . albuterol (PROAIR HFA) 108 (90 BASE) MCG/ACT inhaler Inhale 2 puffs into the lungs every 6 (six) hours as needed for wheezing or shortness of breath.    Review of Systems  Constitutional: Negative.  Negative for fever.  HENT: Positive for congestion.   Eyes: Negative.   Respiratory: Positive for cough and wheezing.   Cardiovascular: Negative.   Gastrointestinal: Negative.   Endocrine: Negative.   Genitourinary: Negative.   Musculoskeletal: Negative.   Skin: Negative.   Allergic/Immunologic: Negative.   Neurological: Negative.   Hematological: Negative.   Psychiatric/Behavioral: Negative.        Objective:   Physical Exam  Nursing note and vitals reviewed. Constitutional: She is oriented to person, place, and time. She appears well-developed and well-nourished. No distress.  HENT:  Head: Normocephalic and atraumatic.  Right Ear: External ear normal.  Left Ear: External ear normal.  Mouth/Throat: Oropharynx is clear and moist. No oropharyngeal exudate.  Nasal congestion and turbinate swelling bilaterally left greater than right  Eyes: Conjunctivae and EOM are normal. Pupils are equal, round, and reactive to light. Right eye exhibits no discharge. Left eye exhibits no discharge. Scleral icterus is present.  Neck: Normal range of motion. Neck supple. No thyromegaly present.  Cardiovascular: Normal  rate, regular rhythm and normal heart sounds.  Exam reveals no gallop and no friction rub.   No murmur heard. At 72 per minute  Pulmonary/Chest: Effort normal. No respiratory distress. She has wheezes. She has no rales.  The breath sounds are distant and there is wheezing bilaterally and congestion with coughing  Musculoskeletal: Normal range of motion.  Neurological: She is alert and oriented to person, place, and time.  Skin: Skin is warm and dry. No rash noted.  Psychiatric: She has a normal mood and affect. Her behavior is normal. Thought content normal.   BP 130/84  Pulse 93  Temp(Src) 98.8 F (37.1 C) (Oral)  Ht 5\' 6"  (1.676 m)  Wt 224 lb (101.606 kg)  BMI 36.17 kg/m2  LMP 03/10/2014        Assessment & Plan:  1. Acute rhinosinusitis - amoxicillin (AMOXIL) 500 MG capsule; Take 1 capsule (500 mg total) by mouth 3 (three) times daily.  Dispense: 30 capsule; Refill: 0  2. Asthmatic bronchitis, mild intermittent, with acute exacerbation - albuterol (PROAIR HFA) 108 (90 BASE) MCG/ACT inhaler; Inhale 2 puffs into the lungs every 6 (six) hours as needed for wheezing or shortness of breath.  Dispense: 2 each; Refill: 2 - predniSONE (DELTASONE) 10 MG tablet; 1 tablet 4 times a day for 2 days,  1 tablet 3 times a day for 2 days,  1 tablet 2 times a day for 2 days, 1 tablet daily for 2 days  Dispense: 20 tablet; Refill: 0 - methylPREDNISolone acetate (DEPO-MEDROL) injection 60 mg; Inject 0.75 mLs (60 mg total) into the  muscle once.  3. Wheezing - albuterol (PROAIR HFA) 108 (90 BASE) MCG/ACT inhaler; Inhale 2 puffs into the lungs every 6 (six) hours as needed for wheezing or shortness of breath.  Dispense: 2 each; Refill: 2 - predniSONE (DELTASONE) 10 MG tablet; 1 tablet 4 times a day for 2 days,  1 tablet 3 times a day for 2 days,  1 tablet 2 times a day for 2 days, 1 tablet daily for 2 days  Dispense: 20 tablet; Refill: 0 - methylPREDNISolone acetate (DEPO-MEDROL) injection 60  mg; Inject 0.75 mLs (60 mg total) into the muscle once. -Samples of Symbicort 160/4.5 given to the patient and explained to use this 2 puffs twice a day and rinse mouth after using  4. Cough - albuterol (PROAIR HFA) 108 (90 BASE) MCG/ACT inhaler; Inhale 2 puffs into the lungs every 6 (six) hours as needed for wheezing or shortness of breath.  Dispense: 2 each; Refill: 2  Patient Instructions  Take medication as directed Use Flonase nightly 1-2 sprays each nostril Take Mucinex maximum strength, Lou and white in color, over-the-counter, one twice daily with a large glass of water for cough    Arrie Senate MD

## 2014-03-24 NOTE — Patient Instructions (Signed)
Take medication as directed Use Flonase nightly 1-2 sprays each nostril Take Mucinex maximum strength, Lou and white in color, over-the-counter, one twice daily with a large glass of water for cough

## 2014-05-18 ENCOUNTER — Other Ambulatory Visit: Payer: Self-pay

## 2014-11-08 ENCOUNTER — Ambulatory Visit (INDEPENDENT_AMBULATORY_CARE_PROVIDER_SITE_OTHER): Payer: PRIVATE HEALTH INSURANCE | Admitting: Nurse Practitioner

## 2014-11-08 ENCOUNTER — Encounter: Payer: Self-pay | Admitting: Nurse Practitioner

## 2014-11-08 VITALS — BP 138/86 | HR 97 | Temp 97.5°F | Ht 66.0 in | Wt 208.0 lb

## 2014-11-08 DIAGNOSIS — N61 Inflammatory disorders of breast: Secondary | ICD-10-CM | POA: Diagnosis not present

## 2014-11-08 MED ORDER — SULFAMETHOXAZOLE-TRIMETHOPRIM 800-160 MG PO TABS: 1.0000 | ORAL_TABLET | Freq: Two times a day (BID) | ORAL | Status: DC

## 2014-11-08 NOTE — Patient Instructions (Signed)

## 2014-11-08 NOTE — Progress Notes (Signed)
   Subjective:    Patient ID: Lynn Golden, female    DOB: 1971-02-02, 44 y.o.   MRN: [id number]  HPI Patient in [place] with a cyst  her left reast- this is  Not  Anything new for her. She develops these areas often that sometime require drainage.    Review of Systems  Constitutional: Negative.   HENT: Negative.   Respiratory: Negative.   Cardiovascular: Negative.   Genitourinary: Negative.   Neurological: Negative.   Psychiatric/Behavioral: Negative.   All other systems reviewed and are negative.      Objective:   Physical Exam  Constitutional: She is oriented to person, place, and time. She appears well-developed and well-nourished.  Cardiovascular: Normal rate, regular rhythm and normal heart sounds.   Pulmonary/Chest: Effort normal and breath sounds normal.  Neurological: She is alert and oriented to person, place, and time.  Skin: Skin is warm and dry.  3cm raised indurated lesion on left breast along breast bone. With distal erythema measuring 10 cm outward and downward. Very tender to touch  Psychiatric: She has a normal mood and affect. Her behavior is normal. Judgment and thought content normal.   BP 138/86 mmHg  Pulse 97  Temp(Src) 97.5 F (36.4 C) (Oral)  Ht 5\' 6"  (1.676 m)  Wt 208 lb (94.348 kg)  BMI 33.59 kg/m2        Assessment & Plan:  1. Cellulitis of female breast Warm compresses RTO if not inproving  Meds ordered this encounter  Medications  . sulfamethoxazole-trimethoprim (BACTRIM DS) 800-160 MG per tablet    Sig: Take 1 tablet by mouth 2 (two) times daily.    Dispense:  14 tablet    Refill:  0    Order Specific Question:  Supervising Provider    Answer:  Chipper Herb [[id number]]    [place], FNP

## 2014-11-12 ENCOUNTER — Other Ambulatory Visit: Payer: Self-pay | Admitting: Nurse Practitioner

## 2014-11-12 ENCOUNTER — Telehealth: Payer: Self-pay | Admitting: Nurse Practitioner

## 2014-11-12 DIAGNOSIS — N61 Mastitis without abscess: Secondary | ICD-10-CM

## 2014-11-12 MED ORDER — DOXYCYCLINE HYCLATE 100 MG PO TABS: 100.0000 mg | ORAL_TABLET | Freq: Two times a day (BID) | ORAL | Status: DC

## 2014-11-12 NOTE — Telephone Encounter (Signed)
Patient states that she is in terrible pain. Place on breast opened a small place yesterday and drained just a little bit but has closed back up. Also states that she has several tunneling areas that also appear to be red and swollen. Spoke with MMM and she sent in new antibiotic to pharmacy and made referral for surgeon. Patient notified. Also gave work note

## 2014-11-13 ENCOUNTER — Telehealth: Payer: Self-pay | Admitting: Nurse Practitioner

## 2015-06-03 ENCOUNTER — Other Ambulatory Visit: Payer: Self-pay | Admitting: Family Medicine

## 2015-06-10 ENCOUNTER — Other Ambulatory Visit: Payer: Self-pay | Admitting: Family Medicine

## 2015-06-18 ENCOUNTER — Telehealth: Payer: Self-pay | Admitting: Nurse Practitioner

## 2015-07-18 ENCOUNTER — Ambulatory Visit (INDEPENDENT_AMBULATORY_CARE_PROVIDER_SITE_OTHER): Payer: PRIVATE HEALTH INSURANCE | Admitting: Nurse Practitioner

## 2015-07-18 ENCOUNTER — Encounter: Payer: Self-pay | Admitting: Nurse Practitioner

## 2015-07-18 VITALS — BP 130/96 | HR 100 | Temp 97.1°F | Ht 66.0 in | Wt 190.0 lb

## 2015-07-18 DIAGNOSIS — J0100 Acute maxillary sinusitis, unspecified: Secondary | ICD-10-CM

## 2015-07-18 DIAGNOSIS — J209 Acute bronchitis, unspecified: Secondary | ICD-10-CM | POA: Diagnosis not present

## 2015-07-18 MED ORDER — AMOXICILLIN 875 MG PO TABS: 875.0000 mg | ORAL_TABLET | Freq: Two times a day (BID) | ORAL | Status: DC

## 2015-07-18 MED ORDER — PREDNISONE 20 MG PO TABS: ORAL_TABLET | ORAL | Status: DC

## 2015-07-18 NOTE — Patient Instructions (Signed)

## 2015-07-18 NOTE — Progress Notes (Signed)
  Subjective:     Lynn Golden is a 44 y.o. female who presents for evaluation of sinus pain. Symptoms include: congestion, cough, facial pain, fevers, headaches, post nasal drip and sinus pressure. Onset of symptoms was 5 days ago. Symptoms have been gradually worsening since that time. Past history is significant for COPD. Patient is a smoker  (1/2 ppd x 20 yrs).  The following portions of the patient's history were reviewed and updated as appropriate: allergies, current medications, past family history, past medical history, past social history, past surgical history and problem list.  Review of Systems Pertinent items are noted in HPI.   Objective:    BP 130/96 mmHg  Pulse 100  Temp(Src) 97.1 F (36.2 C) (Oral)  Ht 5\' 6"  (1.676 m)  Wt 190 lb (86.183 kg)  BMI 30.68 kg/m2 General appearance: alert and cooperative Eyes: conjunctivae/corneas clear. PERRL, EOM's intact. Fundi benign. Ears: normal TM's and external ear canals both ears Nose: copious discharge, moderate congestion, sinus tenderness bilateral Throat: lips, mucosa, and tongue normal; teeth and gums normal Neck: no adenopathy, no carotid bruit, no JVD, supple, symmetrical, trachea midline and thyroid not enlarged, symmetric, no tenderness/mass/nodules Lungs: clear to auscultation bilaterally Heart: regular rate and rhythm, S1, S2 normal, no murmur, click, rub or gallop    Assessment:    Acute bacterial sinusitis.    Plan:   1. Take meds as prescribed 2. Use a cool mist humidifier especially during the winter months and when heat has been humid. 3. Use saline nose sprays frequently 4. Saline irrigations of the nose can be very helpful if done frequently.  * 4X daily for 1 week*  * Use of a nettie pot can be helpful with this. Follow directions with this* 5. Drink plenty of fluids 6. Keep thermostat turn down low 7.For any cough or congestion  Use plain Mucinex- regular strength or max strength is fine   *  Children- consult with Pharmacist for dosing 8. For fever or aces or pains- take tylenol or ibuprofen appropriate for age and weight.  * for fevers greater than 101 orally you may alternate ibuprofen and tylenol every  3 hours.  Meds ordered this encounter  Medications  . amoxicillin (AMOXIL) 875 MG tablet    Sig: Take 1 tablet (875 mg total) by mouth 2 (two) times daily. 1 po BID    Dispense:  20 tablet    Refill:  0    Order Specific Question:  Supervising Provider    Answer:  Chipper Herb [[id number]]  . predniSONE (DELTASONE) 20 MG tablet    Sig: 2 po at sametime daily for 5 days    Dispense:  10 tablet    Refill:  0    Order Specific Question:  Supervising Provider    Answer:  Chipper Herb [[id number]]      [place], FNP

## 2015-07-22 ENCOUNTER — Other Ambulatory Visit: Payer: Self-pay | Admitting: Nurse Practitioner

## 2015-09-25 ENCOUNTER — Telehealth: Payer: Self-pay | Admitting: Nurse Practitioner

## 2015-10-18 ENCOUNTER — Telehealth: Payer: Self-pay | Admitting: *Deleted

## 2015-10-18 NOTE — Telephone Encounter (Signed)
Patient aware and note placed up front.

## 2015-10-18 NOTE — Telephone Encounter (Signed)
Patient was seen in the urgent care after hours and was diagnosed with the flu, pneumonia, and they wrote her a note to be out through yesterday and she is not able to work today could you please extend note.

## 2015-10-18 NOTE — Telephone Encounter (Signed)
Ok to extend note till monday

## 2015-12-04 ENCOUNTER — Ambulatory Visit (INDEPENDENT_AMBULATORY_CARE_PROVIDER_SITE_OTHER): Payer: PRIVATE HEALTH INSURANCE

## 2015-12-04 ENCOUNTER — Encounter: Payer: Self-pay | Admitting: Family Medicine

## 2015-12-04 ENCOUNTER — Ambulatory Visit (INDEPENDENT_AMBULATORY_CARE_PROVIDER_SITE_OTHER): Payer: PRIVATE HEALTH INSURANCE | Admitting: Family Medicine

## 2015-12-04 VITALS — BP 135/86 | HR 121 | Temp 98.0°F | Ht 66.0 in | Wt 186.2 lb

## 2015-12-04 DIAGNOSIS — R1900 Intra-abdominal and pelvic swelling, mass and lump, unspecified site: Secondary | ICD-10-CM

## 2015-12-04 DIAGNOSIS — R0789 Other chest pain: Secondary | ICD-10-CM

## 2015-12-04 DIAGNOSIS — F418 Other specified anxiety disorders: Secondary | ICD-10-CM

## 2015-12-04 DIAGNOSIS — N946 Dysmenorrhea, unspecified: Secondary | ICD-10-CM | POA: Diagnosis not present

## 2015-12-04 MED ORDER — DULOXETINE HCL 30 MG PO CPEP: 90.0000 mg | ORAL_CAPSULE | Freq: Every day | ORAL | Status: DC

## 2015-12-04 NOTE — Addendum Note (Signed)
Addended by: Claretta Fraise on: 12/04/2015 05:00 PM   Modules accepted: Orders

## 2015-12-04 NOTE — Progress Notes (Addendum)
Subjective:  Patient ID: Lynn Golden, female    DOB: 1971-01-21  Age: 45 y.o. MRN: [id number]  CC: Chest Pain   HPI Lynn Golden presents for Increasing episodes of chest pain occurring in substernal region feeling like a pressure and associated with shortness of breath. They do not radiate and they are not exertional. They have been ongoing for several weeks but increasing from minimal perhaps for several months to noticeable several weeks ago. Of note is that she lost her father about 4 months ago. She went through a divorce about 8-10 months ago. She is also a single parent of a 57 year old boy. She has been diagnosed with depression in the past. She mentions that the current symptoms are not associated with activity or food ingestion. She describes them as beginning like a frog kick in followed by a dull pressure lasting 3-5 minutes. Her periods used to be very regular now she is noticing they've become irregular over the last several months lasting from a couple of days to a week flow is variable and they may occur once every 2 weeks to once every 5-6 weeks  GAD 7 : Generalized Anxiety Score 12/04/2015  Nervous, Anxious, on Edge 0  Control/stop worrying 3  Worry too much - different things 3  Trouble relaxing 3  Restless 3  Easily annoyed or irritable 0  Afraid - awful might happen 0  Total GAD 7 Score 12  Anxiety Difficulty Not difficult at all     Depression screen [facility] 2/9 12/04/2015  Decreased Interest 0  Down, Depressed, Hopeless 0  PHQ - 2 Score 0       History Lynn Golden has a past medical history of COPD (chronic obstructive pulmonary disease) (HCC) and Generalized anxiety disorder.   She has past surgical history that includes Tubal ligation; Hernia repair; and Back surgery.   Her family history includes Cancer in her mother; Heart disease in her mother.She reports that she has been smoking Cigarettes.  She has been smoking about 0.25 packs per day. She does not  have any smokeless tobacco history on file. She reports that she does not drink alcohol or use illicit drugs.    ROS Review of Systems  Constitutional: Negative for fever, activity change and appetite change.  HENT: Negative for congestion, rhinorrhea and sore throat.   Eyes: Negative for visual disturbance.  Respiratory: Negative for cough and shortness of breath.   Cardiovascular: Positive for chest pain. Negative for palpitations.  Gastrointestinal: Negative for nausea, abdominal pain and diarrhea.  Genitourinary: Positive for menstrual problem (see HPI). Negative for dysuria.  Musculoskeletal: Negative for myalgias and arthralgias.    Objective:  BP 135/86 mmHg  Pulse 121  Temp(Src) 98 F (36.7 C) (Oral)  Ht '5\' 6"'  (1.676 m)  Wt 186 lb 3.2 oz (84.46 kg)  BMI 30.07 kg/m2  SpO2 96%  LMP 12/03/2015  BP Readings from Last 3 Encounters:  12/04/15 135/86  07/18/15 130/96  11/08/14 138/86    Wt Readings from Last 3 Encounters:  12/04/15 186 lb 3.2 oz (84.46 kg)  07/18/15 190 lb (86.183 kg)  11/08/14 208 lb (94.348 kg)     Physical Exam  Constitutional: She is oriented to person, place, and time. She appears well-developed and well-nourished. No distress.  HENT:  Head: Normocephalic and atraumatic.  Right Ear: External ear normal.  Left Ear: External ear normal.  Nose: Nose normal.  Mouth/Throat: Oropharynx is clear and moist.  Eyes: Conjunctivae and EOM are normal. Pupils are  equal, round, and reactive to light.  Neck: Normal range of motion. Neck supple. No thyromegaly present.  Cardiovascular: Normal rate, regular rhythm and normal heart sounds.   No murmur heard. Pulmonary/Chest: Effort normal and breath sounds normal. No respiratory distress. She has no wheezes. She has no rales.  Abdominal: Soft. Bowel sounds are normal. She exhibits mass (4 cm suprapubic mass). She exhibits no distension. There is tenderness.  Lymphadenopathy:    She has no cervical  adenopathy.  Neurological: She is alert and oriented to person, place, and time. She has normal reflexes.  Skin: Skin is warm and dry.  Psychiatric: She has a normal mood and affect. Her behavior is normal. Judgment and thought content normal.     Lab Results  Component Value Date   WBC 5.5 06/01/2013   HGB 11.4* 06/01/2013   HCT 36.7* 06/01/2013    Ct Abdomen Pelvis Wo Contrast  06/01/2013  CLINICAL DATA:  Left lower quadrant abdominal pain. Pelvic pain. EXAM: CT ABDOMEN AND PELVIS WITHOUT CONTRAST TECHNIQUE: Multidetector CT imaging of the abdomen and pelvis was performed following the standard protocol without intravenous contrast. COMPARISON:  None. FINDINGS: Lower Chest: Clear lung bases. Normal heart size without pericardial or pleural effusion. A tiny hiatal hernia. Abdomen/Pelvis: Normal an unfused appearance of the liver, spleen, distal stomach, pancreas, gallbladder, biliary tract, adrenal glands. No renal calculi or hydronephrosis. No hydroureter or ureteric calculi. Retroaortic left renal vein. No retroperitoneal or retrocrural adenopathy. Normal terminal ileum and appendix. Normal small bowel without abdominal ascites. No pelvic adenopathy. Normal urinary bladder. Multiple uterine fibroids. An anterior centrally necrotic fundal fibroid measures 7.6 cm on image 69. Eccentric left lower uterine segment 6.0 x 6.7 cm fibroid on image 67. Limited evaluation of the adnexa, without definite mass. No significant free fluid. Bones/Musculoskeletal: No acute osseous abnormality. Advanced degenerative disc disease at the lumbosacral junction. IMPRESSION: 1.  No urinary tract calculi or hydronephrosis. 2. Fibroid uterus. 3. No other explanation for patient's symptoms. Electronically Signed   By: Abigail Miyamoto M.D.   On: 06/01/2013 14:05   Dg Abd 1 View  06/01/2013  CLINICAL DATA:  Left lower quadrant abdominal pain. EXAM: ABDOMEN - 1 VIEW COMPARISON:  None. FINDINGS: The bowel gas pattern is  normal. No free air or free fluid. There is no unusual density in the right mid abdomen adjacent to the cecum which is probably an artifact. Slight degenerative changes in the lower lumbar spine. Increased density in the pelvis could represent a distended urinary bladder or an enlarged uterus or ovaries. Does the patient have a normal pelvic exam? IMPRESSION: Increased density in the pelvis could be a distended bladder or enlarged uterus or ovaries. Electronically Signed   By: Rozetta Nunnery M.D.   On: 06/01/2013 13:37    Assessment & Plan:   Daurice was seen today for chest pain.  Diagnoses and all orders for this visit:  Other chest pain -     EKG 12-Lead -     ECHO STRESS TEST; Future -     CMP14+EGFR  Abdominal mass -     DG Abd 2 Views; Future -     US Pelvis Complete; Future -     FSH/LH -     Estrogens, Total -     Progesterone -     CBC with Differential/Platelet -     CMP14+EGFR -     TSH  Anxiety associated with depression -     CMP14+EGFR  Dysmenorrhea -  FSH/LH -     Estrogens, Total -     Progesterone -     CBC with Differential/Platelet -     CMP14+EGFR -     TSH  Other orders -     DULoxetine (CYMBALTA) 30 MG capsule; Take 3 capsules (90 mg total) by mouth daily.    Take 1 daily for 1 week then 2daily until f/u. Then will likely go to 3 daily  I have discontinued Ms. Thilges's amoxicillin and predniSONE. I am also having her start on DULoxetine. Additionally, I am having her maintain her VENTOLIN HFA.  Meds ordered this encounter  Medications  . DULoxetine (CYMBALTA) 30 MG capsule    Sig: Take 3 capsules (90 mg total) by mouth daily.    Dispense:  90 capsule    Refill:  0     Follow-up: Return in about 2 weeks (around 12/18/2015) for chest pain, abd mass, anxiety.  Claretta Fraise, M.D.

## 2015-12-06 LAB — CBC WITH DIFFERENTIAL/PLATELET
Basophils Absolute: 0.1 10*3/uL (ref 0.0–0.2)
Basos: 1 %
EOS (ABSOLUTE): 0 10*3/uL (ref 0.0–0.4)
Eos: 0 %
HEMATOCRIT: 39.7 % (ref 34.0–46.6)
HEMOGLOBIN: 12.9 g/dL (ref 11.1–15.9)
Immature Grans (Abs): 0 10*3/uL (ref 0.0–0.1)
Immature Granulocytes: 0 %
LYMPHS ABS: 1.6 10*3/uL (ref 0.7–3.1)
LYMPHS: 16 %
MCH: 29.1 pg (ref 26.6–33.0)
MCHC: 32.5 g/dL (ref 31.5–35.7)
MCV: 89 fL (ref 79–97)
Monocytes Absolute: 0.5 10*3/uL (ref 0.1–0.9)
Monocytes: 5 %
NEUTROS ABS: 7.6 10*3/uL — AB (ref 1.4–7.0)
NEUTROS PCT: 78 %
Platelets: 419 10*3/uL — ABNORMAL HIGH (ref 150–379)
RBC: 4.44 x10E6/uL (ref 3.77–5.28)
RDW: 17.4 % — ABNORMAL HIGH (ref 12.3–15.4)
WBC: 9.9 10*3/uL (ref 3.4–10.8)

## 2015-12-06 LAB — CMP14+EGFR
ALBUMIN: 4.3 g/dL (ref 3.5–5.5)
ALK PHOS: 57 IU/L (ref 39–117)
ALT: 11 IU/L (ref 0–32)
AST: 11 IU/L (ref 0–40)
Albumin/Globulin Ratio: 1.9 (ref 1.2–2.2)
BILIRUBIN TOTAL: 0.3 mg/dL (ref 0.0–1.2)
BUN / CREAT RATIO: 18 (ref 9–23)
BUN: 16 mg/dL (ref 6–24)
CHLORIDE: 100 mmol/L (ref 96–106)
CO2: 22 mmol/L (ref 18–29)
CREATININE: 0.9 mg/dL (ref 0.57–1.00)
Calcium: 9.5 mg/dL (ref 8.7–10.2)
GFR calc Af Amer: 91 mL/min/{1.73_m2} (ref >59)
GFR calc non Af Amer: 79 mL/min/{1.73_m2} (ref >59)
GLOBULIN, TOTAL: 2.3 g/dL (ref 1.5–4.5)
Glucose: 90 mg/dL (ref 65–99)
Potassium: 4.7 mmol/L (ref 3.5–5.2)
SODIUM: 140 mmol/L (ref 134–144)
Total Protein: 6.6 g/dL (ref 6.0–8.5)

## 2015-12-06 LAB — FSH/LH
FSH: 13.9 m[IU]/mL
LH: 6.8 m[IU]/mL

## 2015-12-06 LAB — ESTROGENS, TOTAL: ESTROGEN: 111 pg/mL

## 2015-12-06 LAB — PROGESTERONE: PROGESTERONE: 0.7 ng/mL

## 2015-12-09 ENCOUNTER — Other Ambulatory Visit: Payer: Self-pay | Admitting: Family Medicine

## 2015-12-09 DIAGNOSIS — R1900 Intra-abdominal and pelvic swelling, mass and lump, unspecified site: Secondary | ICD-10-CM

## 2015-12-11 ENCOUNTER — Ambulatory Visit (HOSPITAL_COMMUNITY)
Admission: RE | Admit: 2015-12-11 | Discharge: 2015-12-11 | Disposition: A | Discharge status: Home / Self Care | Payer: PRIVATE HEALTH INSURANCE | Source: Ambulatory Visit | Attending: Family Medicine | Admitting: Family Medicine

## 2015-12-11 ENCOUNTER — Telehealth: Payer: Self-pay | Admitting: *Deleted

## 2015-12-11 DIAGNOSIS — D259 Leiomyoma of uterus, unspecified: Secondary | ICD-10-CM | POA: Diagnosis not present

## 2015-12-11 DIAGNOSIS — N852 Hypertrophy of uterus: Secondary | ICD-10-CM | POA: Insufficient documentation

## 2015-12-11 DIAGNOSIS — N83202 Unspecified ovarian cyst, left side: Secondary | ICD-10-CM | POA: Insufficient documentation

## 2015-12-11 DIAGNOSIS — R1900 Intra-abdominal and pelvic swelling, mass and lump, unspecified site: Secondary | ICD-10-CM | POA: Diagnosis not present

## 2015-12-11 NOTE — Telephone Encounter (Signed)
-----   Message from Claretta Fraise, MD sent at 12/11/2015  5:11 PM EDT ----- Maia Petties, The lump noted on your exam turned out to be your fibroids. They appear harmless, but if they cause pain or bleeding you need to see a gyn Best Regards, Claretta Fraise, M.D.

## 2016-02-06 ENCOUNTER — Telehealth: Payer: Self-pay | Admitting: Family Medicine

## 2016-02-06 NOTE — Telephone Encounter (Signed)
Spoke with pt and advised both Dr.Stacks and Dr.Dettinger are out of the office this week and that I would forward this call to both of them and we will call her back once they have looked at her chart and decided if we are going to do the Stress test here or Cardiology. Pt voiced understanding.

## 2016-02-10 NOTE — Telephone Encounter (Signed)
appt scheduled for treadmill Pt notified

## 2016-02-10 NOTE — Telephone Encounter (Signed)
As long as she can breathe enough to walk then she can be scheduled with [place] for stress testing

## 2016-02-13 ENCOUNTER — Other Ambulatory Visit: Payer: Self-pay | Admitting: *Deleted

## 2016-02-13 DIAGNOSIS — R079 Chest pain, unspecified: Secondary | ICD-10-CM

## 2016-03-03 ENCOUNTER — Encounter: Payer: Self-pay | Admitting: *Deleted

## 2016-03-04 ENCOUNTER — Ambulatory Visit (INDEPENDENT_AMBULATORY_CARE_PROVIDER_SITE_OTHER): Payer: PRIVATE HEALTH INSURANCE | Admitting: Cardiology

## 2016-03-04 ENCOUNTER — Encounter: Payer: Self-pay | Admitting: *Deleted

## 2016-03-04 ENCOUNTER — Encounter: Payer: Self-pay | Admitting: Cardiology

## 2016-03-04 VITALS — BP 116/72 | HR 88 | Ht 66.0 in | Wt 185.6 lb

## 2016-03-04 DIAGNOSIS — R079 Chest pain, unspecified: Secondary | ICD-10-CM

## 2016-03-04 DIAGNOSIS — R0789 Other chest pain: Secondary | ICD-10-CM | POA: Diagnosis not present

## 2016-03-04 NOTE — Patient Instructions (Signed)
Your physician recommends that you schedule a follow-up appointment TO BE DETERMINED AFTER TESTING   Your physician recommends that you continue on your current medications as directed. Please refer to the Current Medication list given to you today.  Your physician has requested that you have en exercise stress myoview. For further information please visit [email or web address]. Please follow instruction sheet, as given.  Thank you for choosing [facility]!!   

## 2016-03-04 NOTE — Progress Notes (Signed)
     Clinical Summary Lynn Golden is a 45 y.o.female seen today as a new patient, she is referred by Dr Lynn Golden.  1. Chest pain - started about 6 months ago. Twisting feeling left chest, 8/10. Can occur at anytime. Can have some SOB associated, feels clammy. Pain lasts about 40 seconds. Occurs 4-5 times a day. - notes some recent DOE with activities. No LE edema, can have some orthopnea - CAD risk factors: tobacco x 20 years, mother valve replacement 76s,       SH: works as Research scientist (physical sciences) at [employer].   Past Medical History:  Diagnosis Date  . COPD (chronic obstructive pulmonary disease) ([place])   . Generalized anxiety disorder      Allergies  Allergen Reactions  . Codeine      Current Outpatient Prescriptions  Medication Sig Dispense Refill  . DULoxetine (CYMBALTA) 30 MG capsule Take 3 capsules (90 mg total) by mouth daily. 90 capsule 0  . VENTOLIN HFA 108 (90 BASE) MCG/ACT inhaler USE 2 PUFFS EVERY 6 HOURS AS NEEDED 36 g 6   No current facility-administered medications for this visit.      Past Surgical History:  Procedure Laterality Date  . BACK SURGERY    . HERNIA REPAIR    . TUBAL LIGATION       Allergies  Allergen Reactions  . Codeine       Family History  Problem Relation Age of Onset  . Heart disease Mother   . Cancer Mother     lung     Social History Lynn Golden reports that she has been smoking Cigarettes.  She has been smoking about 0.25 packs per day. She does not have any smokeless tobacco history on file. Lynn Golden reports that she does not drink alcohol.   Review of Systems CONSTITUTIONAL: No weight loss, fever, chills, weakness or fatigue.  HEENT: Eyes: No visual loss, blurred vision, double vision or yellow sclerae.No hearing loss, sneezing, congestion, runny nose or sore throat.  SKIN: No rash or itching.  CARDIOVASCULAR: per HPI RESPIRATORY: No shortness of breath, cough or sputum.  GASTROINTESTINAL: No anorexia, nausea,  vomiting or diarrhea. No abdominal pain or blood.  GENITOURINARY: No burning on urination, no polyuria NEUROLOGICAL: No headache, dizziness, syncope, paralysis, ataxia, numbness or tingling in the extremities. No change in bowel or bladder control.  MUSCULOSKELETAL: No muscle, back pain, joint pain or stiffness.  LYMPHATICS: No enlarged nodes. No history of splenectomy.  PSYCHIATRIC: No history of depression or anxiety.  ENDOCRINOLOGIC: No reports of sweating, cold or heat intolerance. No polyuria or polydipsia.  Marland Kitchen   Physical Examination Vitals:   03/04/16 0928  BP: 116/72  Pulse: 88   Vitals:   03/04/16 0928  Weight: 185 lb 9.6 oz (84.2 kg)  Height: 5\' 6"  (1.676 m)    Gen: resting comfortably, no acute distress HEENT: no scleral icterus, pupils equal round and reactive, no palptable cervical adenopathy,  CV: RRR, no m/r/g, no jvd Resp: Clear to auscultation bilaterally GI: abdomen is soft, non-tender, non-distended, normal bowel sounds, no hepatosplenomegaly MSK: extremities are warm, no edema.  Skin: warm, no rash Neuro:  no focal deficits Psych: appropriate affect      Assessment and Plan  1. Chest pain  - unclear etiology, we will obtain an exercise cardiolite to further evaluate.    F/u pending stress results  Lynn Golden, M.D.

## 2016-03-18 ENCOUNTER — Encounter (HOSPITAL_COMMUNITY)
Admission: RE | Admit: 2016-03-18 | Discharge: 2016-03-18 | Disposition: A | Discharge status: Home / Self Care | Payer: PRIVATE HEALTH INSURANCE | Source: Ambulatory Visit | Attending: Cardiology | Admitting: Cardiology

## 2016-03-18 ENCOUNTER — Encounter (HOSPITAL_COMMUNITY): Payer: Self-pay

## 2016-03-18 ENCOUNTER — Ambulatory Visit (HOSPITAL_COMMUNITY): Admission: RE | Admit: 2016-03-18 | Payer: PRIVATE HEALTH INSURANCE | Source: Ambulatory Visit

## 2016-03-18 DIAGNOSIS — R079 Chest pain, unspecified: Secondary | ICD-10-CM

## 2016-03-18 HISTORY — DX: Malignant (primary) neoplasm, unspecified: C80.1

## 2016-03-18 LAB — NM MYOCAR MULTI W/SPECT W/WALL MOTION / EF
CHL CUP MPHR: 177 {beats}/min
CHL CUP NUCLEAR SDS: 6
CHL CUP NUCLEAR SSS: 6
CSEPED: 6 min
CSEPHR: 88 %
CSEPPHR: 157 {beats}/min
Estimated workload: 7 METS
Exercise duration (sec): 0 s
LHR: 0.33
LV dias vol: 94 mL (ref 46–106)
LV sys vol: 46 mL
NUC STRESS TID: 0.93
RPE: 14
Rest HR: 83 {beats}/min
SRS: 0

## 2016-03-18 MED ORDER — REGADENOSON 0.4 MG/5ML IV SOLN
INTRAVENOUS | Status: AC
  Filled 2016-03-18: qty 5

## 2016-03-18 MED ORDER — TECHNETIUM TC 99M TETROFOSMIN IV KIT
30.0000 | PACK | Freq: Once | INTRAVENOUS | Status: AC | PRN
  Administered 2016-03-18: 31 via INTRAVENOUS

## 2016-03-18 MED ORDER — TECHNETIUM TC 99M TETROFOSMIN IV KIT
10.0000 | PACK | Freq: Once | INTRAVENOUS | Status: AC | PRN
  Administered 2016-03-18: 10.4 via INTRAVENOUS

## 2016-03-18 MED ORDER — SODIUM CHLORIDE 0.9% FLUSH
INTRAVENOUS | Status: AC
  Administered 2016-03-18: 10 mL via INTRAVENOUS
  Filled 2016-03-18: qty 10

## 2016-03-20 ENCOUNTER — Telehealth: Payer: Self-pay | Admitting: *Deleted

## 2016-03-20 NOTE — Telephone Encounter (Signed)
-----   Message from Arnoldo Lenis, MD sent at 03/20/2016 12:22 PM EDT ----- Stress test looks good, no evidence of any blockages. She should f/u with her pcp to discuss nonheart causes of chest pain. She can f/u with [place] in 2 months to reevalaute symptoms   Lynn Abts MD

## 2016-03-20 NOTE — Telephone Encounter (Signed)
Pt aware, did not want to make appt at this time, said she would call back. Recall placed, routed to pcp

## 2016-04-27 ENCOUNTER — Encounter: Payer: Self-pay | Admitting: Nurse Practitioner

## 2016-04-27 ENCOUNTER — Ambulatory Visit (INDEPENDENT_AMBULATORY_CARE_PROVIDER_SITE_OTHER): Payer: PRIVATE HEALTH INSURANCE | Admitting: Nurse Practitioner

## 2016-04-27 VITALS — BP 124/85 | HR 100 | Temp 98.4°F | Ht 66.0 in | Wt 178.4 lb

## 2016-04-27 DIAGNOSIS — J209 Acute bronchitis, unspecified: Secondary | ICD-10-CM

## 2016-04-27 DIAGNOSIS — J42 Unspecified chronic bronchitis: Secondary | ICD-10-CM | POA: Diagnosis not present

## 2016-04-27 MED ORDER — METHYLPREDNISOLONE ACETATE 80 MG/ML IJ SUSP
80.0000 mg | Freq: Once | INTRAMUSCULAR | Status: AC
  Administered 2016-04-27: 80 mg via INTRAMUSCULAR

## 2016-04-27 MED ORDER — AMOXICILLIN 875 MG PO TABS: 875.0000 mg | ORAL_TABLET | Freq: Two times a day (BID) | ORAL | 0 refills | Status: DC

## 2016-04-27 MED ORDER — ALBUTEROL SULFATE HFA 108 (90 BASE) MCG/ACT IN AERS: 2.0000 | INHALATION_SPRAY | Freq: Four times a day (QID) | RESPIRATORY_TRACT | 6 refills | Status: DC | PRN

## 2016-04-27 MED ORDER — LEVALBUTEROL HCL 1.25 MG/3ML IN NEBU
1.2500 mg | INHALATION_SOLUTION | Freq: Once | RESPIRATORY_TRACT | Status: AC
  Administered 2016-04-27: 1.25 mg via RESPIRATORY_TRACT

## 2016-04-27 NOTE — Progress Notes (Signed)
Subjective:     Lynn Golden is a 45 y.o. female who presents for evaluation of sinus pain. Symptoms include: congestion, cough, facial pain, headaches, nasal congestion and sinus pressure. Onset of symptoms was 2 weeks ago. Symptoms have been gradually worsening since that time. Past history is significant for occasional episodes of bronchitis and chronic bronchitis. Patient is a smoker  (1/2-3/4  ppd x 20 yrs).  The following portions of the patient's history were reviewed and updated as appropriate: allergies, current medications, past family history, past medical history, past social history, past surgical history and problem list.  Review of Systems Pertinent items noted in HPI and remainder of comprehensive ROS otherwise negative.   Objective:    BP 124/85 (BP Location: Left Arm, Patient Position: Sitting, Cuff Size: Normal)   Pulse 100   Temp 98.4 F (36.9 C) (Oral)   Ht 5\' 6"  (1.676 m)   Wt 178 lb 6.4 oz (80.9 kg)   LMP 04/25/2016   SpO2 97%   BMI 28.79 kg/m  General appearance: alert and cooperative Eyes: conjunctivae/corneas clear. PERRL, EOM's intact. Fundi benign. Ears: normal TM's and external ear canals both ears Nose: copious and mucoid discharge, moderate congestion, turbinates red, sinus tenderness bilateral Throat: lips, mucosa, and tongue normal; teeth and gums normal Neck: no adenopathy, no carotid bruit, no JVD, supple, symmetrical, trachea midline and thyroid not enlarged, symmetric, no tenderness/mass/nodules Lungs: wheezes insp and exp wheezes throughout Heart: regular rate and rhythm, S1, S2 normal, no murmur, click, rub or gallop    S/P xopenex neb-   Assessment:    Acute bacterial sinusitis and acute exacerbation chronic bronchitis.    Plan:   1. Take meds as prescribed 2. Use a cool mist humidifier especially during the winter months and when heat has been humid. 3. Use saline nose sprays frequently 4. Saline irrigations of the nose can be very  helpful if done frequently.  * 4X daily for 1 week*  * Use of a nettie pot can be helpful with this. Follow directions with this* 5. Drink plenty of fluids 6. Keep thermostat turn down low 7.For any cough or congestion  Use plain Mucinex- regular strength or max strength is fine   * Children- consult with Pharmacist for dosing 8. For fever or aces or pains- take tylenol or ibuprofen appropriate for age and weight.  * for fevers greater than 101 orally you may alternate ibuprofen and tylenol every  3 hours.   Meds ordered this encounter  Medications  . levalbuterol (XOPENEX) nebulizer solution 1.25 mg  . methylPREDNISolone acetate (DEPO-MEDROL) injection 80 mg  . amoxicillin (AMOXIL) 875 MG tablet    Sig: Take 1 tablet (875 mg total) by mouth 2 (two) times daily. 1 po BID    Dispense:  20 tablet    Refill:  0    Order Specific Question:   Supervising Provider    Answer:   Eustaquio Maize [[id number]]   STOP SMOKING!!!! Mary-Margaret Hassell Done, FNP

## 2016-04-27 NOTE — Patient Instructions (Signed)
Smoking Cessation, Tips for Success If you are ready to quit smoking, congratulations! You have chosen to help yourself be healthier. Cigarettes bring nicotine, tar, carbon monoxide, and other irritants into your body. Your lungs, heart, and blood vessels will be able to work better without these poisons. There are many different ways to quit smoking. Nicotine gum, nicotine patches, a nicotine inhaler, or nicotine nasal spray can help with physical craving. Hypnosis, support groups, and medicines help break the habit of smoking. WHAT THINGS CAN I DO TO MAKE QUITTING EASIER?  Here are some tips to help you quit for good:  Pick a date when you will quit smoking completely. Tell all of your friends and family about your plan to quit on that date.  Do not try to slowly cut down on the number of cigarettes you are smoking. Pick a quit date and quit smoking completely starting on that day.  Throw away all cigarettes.   Clean and remove all ashtrays from your home, work, and car.  On a card, write down your reasons for quitting. Carry the card with you and read it when you get the urge to smoke.  Cleanse your body of nicotine. Drink enough water and fluids to keep your urine clear or pale yellow. Do this after quitting to flush the nicotine from your body.  Learn to predict your moods. Do not let a bad situation be your excuse to have a cigarette. Some situations in your life might tempt you into wanting a cigarette.  Never have "just one" cigarette. It leads to wanting another and another. Remind yourself of your decision to quit.  Change habits associated with smoking. If you smoked while driving or when feeling stressed, try other activities to replace smoking. Stand up when drinking your coffee. Brush your teeth after eating. Sit in a different chair when you read the paper. Avoid alcohol while trying to quit, and try to drink fewer caffeinated beverages. Alcohol and caffeine may urge you to  smoke.  Avoid foods and drinks that can trigger a desire to smoke, such as sugary or spicy foods and alcohol.  Ask people who smoke not to smoke around you.  Have something planned to do right after eating or having a cup of coffee. For example, plan to take a walk or exercise.  Try a relaxation exercise to calm you down and decrease your stress. Remember, you may be tense and nervous for the first 2 weeks after you quit, but this will pass.  Find new activities to keep your hands busy. Play with a pen, coin, or rubber band. Doodle or draw things on paper.  Brush your teeth right after eating. This will help cut down on the craving for the taste of tobacco after meals. You can also try mouthwash.   Use oral substitutes in place of cigarettes. Try using lemon drops, carrots, cinnamon sticks, or chewing gum. Keep them handy so they are available when you have the urge to smoke.  When you have the urge to smoke, try deep breathing.  Designate your home as a nonsmoking area.  If you are a heavy smoker, ask your health care provider about a prescription for nicotine chewing gum. It can ease your withdrawal from nicotine.  Reward yourself. Set aside the cigarette money you save and buy yourself something nice.  Look for support from others. Join a support group or smoking cessation program. Ask someone at home or at work to help you with your plan   to quit smoking.  Always ask yourself, "Do I need this cigarette or is this just a reflex?" Tell yourself, "Today, I choose not to smoke," or "I do not want to smoke." You are reminding yourself of your decision to quit.  Do not replace cigarette smoking with electronic cigarettes (commonly called e-cigarettes). The safety of e-cigarettes is unknown, and some may contain harmful chemicals.  If you relapse, do not give up! Plan ahead and think about what you will do the next time you get the urge to smoke. HOW WILL I FEEL WHEN I QUIT SMOKING? You  may have symptoms of withdrawal because your body is used to nicotine (the addictive substance in cigarettes). You may crave cigarettes, be irritable, feel very hungry, cough often, get headaches, or have difficulty concentrating. The withdrawal symptoms are only temporary. They are strongest when you first quit but will go away within 10-14 days. When withdrawal symptoms occur, stay in control. Think about your reasons for quitting. Remind yourself that these are signs that your body is healing and getting used to being without cigarettes. Remember that withdrawal symptoms are easier to treat than the major diseases that smoking can cause.  Even after the withdrawal is over, expect periodic urges to smoke. However, these cravings are generally short lived and will go away whether you smoke or not. Do not smoke! WHAT RESOURCES ARE AVAILABLE TO HELP ME QUIT SMOKING? Your health care provider can direct you to community resources or hospitals for support, which may include:  Group support.  Education.  Hypnosis.  Therapy.   This information is not intended to replace advice given to you by your health care provider. Make sure you discuss any questions you have with your health care provider.   Document Released: 04/17/2004 Document Revised: 08/10/2014 Document Reviewed: 01/05/2013 Elsevier Interactive Patient Education 2016 Elsevier Inc.  

## 2016-04-30 ENCOUNTER — Other Ambulatory Visit: Payer: Self-pay

## 2016-04-30 ENCOUNTER — Ambulatory Visit (INDEPENDENT_AMBULATORY_CARE_PROVIDER_SITE_OTHER): Payer: PRIVATE HEALTH INSURANCE | Admitting: Nurse Practitioner

## 2016-04-30 ENCOUNTER — Encounter: Payer: Self-pay | Admitting: Nurse Practitioner

## 2016-04-30 VITALS — BP 144/90 | HR 83 | Temp 97.8°F | Ht 66.0 in | Wt 183.0 lb

## 2016-04-30 DIAGNOSIS — N6320 Unspecified lump in the left breast, unspecified quadrant: Secondary | ICD-10-CM

## 2016-04-30 DIAGNOSIS — N63 Unspecified lump in breast: Secondary | ICD-10-CM | POA: Diagnosis not present

## 2016-04-30 NOTE — Progress Notes (Signed)
   Subjective:    Patient ID: Lynn Golden, female    DOB: 01/11/1971, 45 y.o.   MRN: [id number]  HPI Patient comes in c/o left brest ain- now it is red and swollen- she was seen Monday with sinus infection and COPd and was given augmentin and is still taking.    Review of Systems  Constitutional: Negative.   HENT: Negative.   Respiratory: Positive for cough and shortness of breath.   Cardiovascular: Negative for chest pain, palpitations and leg swelling.  Gastrointestinal: Negative.   Genitourinary: Negative.   Neurological: Negative.   Psychiatric/Behavioral: Negative.   All other systems reviewed and are negative.      Objective:   Physical Exam  Constitutional: She is oriented to person, place, and time. She appears well-developed and well-nourished. No distress.  Cardiovascular: Normal rate, regular rhythm and normal heart sounds.   Pulmonary/Chest: She has wheezes (insp wheezes throughout). Left breast exhibits tenderness (10cm tender fid filled mass left breast).  Neurological: She is alert and oriented to person, place, and time.  Skin: Skin is warm. No erythema.  Psychiatric: She has a normal mood and affect. Her behavior is normal. Judgment and thought content normal.   BP (!) 144/90 (BP Location: Left Arm, Cuff Size: Normal)   Pulse 83   Temp 97.8 F (36.6 C) (Oral)   Ht 5\' 6"  (1.676 m)   Wt 183 lb (83 kg)   LMP 04/25/2016   BMI 29.54 kg/m          Assessment & Plan:  1. Left breast mass Continue to watch Will talk after results are back - US BREAST LTD UNI LEFT INC AXILLA; Future  Mary-Margaret Hassell Done, FNP

## 2016-04-30 NOTE — Addendum Note (Signed)
Addended by: Chevis Pretty on: 04/30/2016 03:13 PM   Modules accepted: Orders

## 2016-05-01 ENCOUNTER — Ambulatory Visit
Admission: RE | Admit: 2016-05-01 | Discharge: 2016-05-01 | Disposition: A | Discharge status: Home / Self Care | Payer: PRIVATE HEALTH INSURANCE | Source: Ambulatory Visit | Attending: Nurse Practitioner | Admitting: Nurse Practitioner

## 2016-05-01 ENCOUNTER — Other Ambulatory Visit: Payer: Self-pay | Admitting: Nurse Practitioner

## 2016-05-01 DIAGNOSIS — N6310 Unspecified lump in the right breast, unspecified quadrant: Secondary | ICD-10-CM

## 2016-05-01 DIAGNOSIS — N6320 Unspecified lump in the left breast, unspecified quadrant: Secondary | ICD-10-CM

## 2016-08-18 ENCOUNTER — Telehealth: Payer: Self-pay | Admitting: Cardiology

## 2016-08-18 NOTE — Telephone Encounter (Signed)
Numerous attempts to contact patient with recall letters. Unable to reach by telephone. with no success.   [id number] 03/20/2016 2:52 PM New [10]    [System] 04/03/2016 11:04 PM Notification Sent [20]   Lynn Golden [id number] 08/04/2016 2:20 PM Notification Sent [20]  Scheduling Instructions

## 2016-08-21 ENCOUNTER — Ambulatory Visit (INDEPENDENT_AMBULATORY_CARE_PROVIDER_SITE_OTHER): Payer: PRIVATE HEALTH INSURANCE | Admitting: Nurse Practitioner

## 2016-08-21 ENCOUNTER — Encounter: Payer: Self-pay | Admitting: Nurse Practitioner

## 2016-08-21 VITALS — BP 117/80 | HR 116 | Temp 98.7°F | Ht 66.0 in | Wt 182.0 lb

## 2016-08-21 DIAGNOSIS — J441 Chronic obstructive pulmonary disease with (acute) exacerbation: Secondary | ICD-10-CM | POA: Diagnosis not present

## 2016-08-21 DIAGNOSIS — R52 Pain, unspecified: Secondary | ICD-10-CM | POA: Diagnosis not present

## 2016-08-21 LAB — VERITOR FLU A/B WAIVED
INFLUENZA A: NEGATIVE
Influenza B: NEGATIVE

## 2016-08-21 MED ORDER — BENZONATATE 100 MG PO CAPS: 100.0000 mg | ORAL_CAPSULE | Freq: Three times a day (TID) | ORAL | 0 refills | Status: DC | PRN

## 2016-08-21 MED ORDER — AZITHROMYCIN 250 MG PO TABS: ORAL_TABLET | ORAL | 0 refills | Status: DC

## 2016-08-21 NOTE — Progress Notes (Signed)
   Subjective:    Patient ID: Lynn Golden, female    DOB: 12-02-1970, 46 y.o.   MRN: [id number]  HPI  Patient comes in c/o nausea, vomiting and diarrhea that started on MOnday and resolved on Tuesday. Tuesday evening she started running a fever, with cough and congestion-. Had fever of 102 on Wednesday but no fever since yesterday afternoon.    Review of Systems  Constitutional: Positive for fever.  HENT: Positive for congestion and sore throat.   Respiratory: Positive for cough (productive).   Cardiovascular: Negative.   Gastrointestinal: Positive for nausea (resolved) and vomiting (resolved).  Genitourinary: Negative.   Neurological: Negative.   Psychiatric/Behavioral: Negative.        Objective:   Physical Exam  Constitutional: She is oriented to person, place, and time. She appears well-developed and well-nourished. No distress.  HENT:  Right Ear: Hearing, tympanic membrane, external ear and ear canal normal.  Left Ear: Hearing, tympanic membrane, external ear and ear canal normal.  Nose: Mucosal edema and rhinorrhea present. Right sinus exhibits no maxillary sinus tenderness and no frontal sinus tenderness. Left sinus exhibits no maxillary sinus tenderness and no frontal sinus tenderness.  Mouth/Throat: Uvula is midline, oropharynx is clear and moist and mucous membranes are normal.  Eyes: Pupils are equal, round, and reactive to light.  Neck: Normal range of motion. Neck supple.  Cardiovascular: Normal rate and regular rhythm.   Pulmonary/Chest: Effort normal. She has wheezes (exp wheezes).  Deep tight cough  Neurological: She is alert and oriented to person, place, and time.  Skin: Skin is warm and dry.  Psychiatric: She has a normal mood and affect. Her behavior is normal. Judgment and thought content normal.    BP 117/80   Pulse (!) 116   Temp 98.7 F (37.1 C) (Oral)   Ht 5\' 6"  (1.676 m)   Wt 182 lb (82.6 kg)   BMI 29.38 kg/m   Flu negative        Assessment & Plan:  1. Body aches - Veritor Flu A/B Waived  2. COPD exacerbation ([place]) 1. Take meds as prescribed 2. Use a cool mist humidifier especially during the winter months and when heat has been humid. 3. Use saline nose sprays frequently 4. Saline irrigations of the nose can be very helpful if done frequently.  * 4X daily for 1 week*  * Use of a nettie pot can be helpful with this. Follow directions with this* 5. Drink plenty of fluids 6. Keep thermostat turn down low 7.For any cough or congestion  Use plain Mucinex- regular strength or max strength is fine   * Children- consult with Pharmacist for dosing 8. For fever or aces or pains- take tylenol or ibuprofen appropriate for age and weight.  * for fevers greater than 101 orally you may alternate ibuprofen and tylenol every  3 hours.   - azithromycin (ZITHROMAX Z-PAK) 250 MG tablet; As directed  Dispense: 6 tablet; Refill: 0 - benzonatate (TESSALON PERLES) 100 MG capsule; Take 1 capsule (100 mg total) by mouth 3 (three) times daily as needed for cough.  Dispense: 20 capsule; Refill: 0  Mary-Margaret Hassell Done, FNP

## 2016-08-21 NOTE — Patient Instructions (Signed)

## 2016-08-24 ENCOUNTER — Encounter: Payer: Self-pay | Admitting: Nurse Practitioner

## 2016-08-24 ENCOUNTER — Ambulatory Visit (INDEPENDENT_AMBULATORY_CARE_PROVIDER_SITE_OTHER): Payer: PRIVATE HEALTH INSURANCE | Admitting: Nurse Practitioner

## 2016-08-24 VITALS — BP 117/81 | HR 123 | Temp 98.3°F | Ht 66.0 in | Wt 179.0 lb

## 2016-08-24 DIAGNOSIS — J441 Chronic obstructive pulmonary disease with (acute) exacerbation: Secondary | ICD-10-CM | POA: Diagnosis not present

## 2016-08-24 MED ORDER — METHYLPREDNISOLONE ACETATE 80 MG/ML IJ SUSP
80.0000 mg | Freq: Once | INTRAMUSCULAR | Status: AC
  Administered 2016-08-24: 80 mg via INTRAMUSCULAR

## 2016-08-24 MED ORDER — LEVALBUTEROL HCL 1.25 MG/3ML IN NEBU
1.2500 mg | INHALATION_SOLUTION | RESPIRATORY_TRACT | Status: AC
  Administered 2016-08-24: 1.25 mg via RESPIRATORY_TRACT

## 2016-08-24 MED ORDER — PREDNISONE 20 MG PO TABS: ORAL_TABLET | ORAL | 0 refills | Status: DC

## 2016-08-24 NOTE — Patient Instructions (Signed)
Chronic Obstructive Pulmonary Disease Exacerbation  Chronic obstructive pulmonary disease (COPD) is a common lung problem. In COPD, the flow of air from the lungs is limited. COPD exacerbations are times that breathing gets worse and you need extra treatment. Without treatment they can be life threatening. If they happen often, your lungs can become more damaged. If your COPD gets worse, your doctor may treat you with:  ? Medicines.  ? Oxygen.  ? Different ways to clear your airway, such as using a mask.    Follow these instructions at home:  ? Do not smoke.  ? Avoid tobacco smoke and other things that bother your lungs.  ? If given, take your antibiotic medicine as told. Finish the medicine even if you start to feel better.  ? Only take medicines as told by your doctor.  ? Drink enough fluids to keep your pee (urine) clear or pale yellow (unless your doctor has told you not to).  ? Use a cool mist machine (vaporizer).  ? If you use oxygen or a machine that turns liquid medicine into a mist (nebulizer), continue to use them as told.  ? Keep up with shots (vaccinations) as told by your doctor.  ? Exercise regularly.  ? Eat healthy foods.  ? Keep all doctor visits as told.  Get help right away if:  ? You are very short of breath and it gets worse.  ? You have trouble talking.  ? You have bad chest pain.  ? You have blood in your spit (sputum).  ? You have a fever.  ? You keep throwing up (vomiting).  ? You feel weak, or you pass out (faint).  ? You feel confused.  ? You keep getting worse.  This information is not intended to replace advice given to you by your health care provider. Make sure you discuss any questions you have with your health care provider.  Document Released: 07/09/2011 Document Revised: 12/26/2015 Document Reviewed: 03/24/2013  Elsevier Interactive Patient Education ? 2017 Elsevier Inc.

## 2016-08-24 NOTE — Progress Notes (Signed)
   Subjective:    Patient ID: Lynn Golden, female    DOB: August 29, 1970, 46 y.o.   MRN: [id number]  HPI Patient comes in today c/o cough being no better. She was seen on 08/21/16 and was dx with COPD exacerbation- sh ewas given a z pak as well as tessalon perles and was told to avoid cigarette somoke. Se says she is no longer running a fever or is achy but cough is no better.    Review of Systems  Constitutional: Negative for chills and fever.  HENT: Positive for congestion and sinus pressure. Negative for ear pain.   Respiratory: Positive for cough, shortness of breath and wheezing.   Cardiovascular: Negative.   Gastrointestinal: Negative.   Genitourinary: Negative.   Musculoskeletal: Negative.   Neurological: Negative.   Psychiatric/Behavioral: Negative.        Objective:   Physical Exam  Constitutional: She is oriented to person, place, and time. She appears well-developed and well-nourished. She appears distressed.  HENT:  Right Ear: Hearing, tympanic membrane, external ear and ear canal normal.  Left Ear: Hearing, tympanic membrane, external ear and ear canal normal.  Nose: Mucosal edema and rhinorrhea present. Right sinus exhibits no maxillary sinus tenderness and no frontal sinus tenderness. Left sinus exhibits no maxillary sinus tenderness and no frontal sinus tenderness.  Mouth/Throat: Uvula is midline, oropharynx is clear and moist and mucous membranes are normal.  Eyes: Pupils are equal, round, and reactive to light.  Neck: Normal range of motion. Neck supple.  Cardiovascular: Normal rate and regular rhythm.   Pulmonary/Chest: She has wheezes (tight expiratory wheezes).  Abdominal: Soft.  Lymphadenopathy:    She has no cervical adenopathy.  Neurological: She is alert and oriented to person, place, and time.  Skin: Skin is warm.  Psychiatric: She has a normal mood and affect. Her behavior is normal. Judgment and thought content normal.    BP 117/81   Pulse (!) 123    Temp 98.3 F (36.8 C) (Oral)   Ht 5\' 6"  (1.676 m)   Wt 179 lb (81.2 kg)   SpO2 96%   BMI 28.89 kg/m   S/P xopenex neb- looser exp wheezes througput      Assessment & Plan:   1. COPD exacerbation ([place])    Meds ordered this encounter  Medications  . levalbuterol (XOPENEX) nebulizer solution 1.25 mg  . methylPREDNISolone acetate (DEPO-MEDROL) injection 80 mg  . predniSONE (DELTASONE) 20 MG tablet    Sig: 2 po at sametime daily for 5 days- start tomorrow    Dispense:  10 tablet    Refill:  0    Order Specific Question:   Supervising Provider    Answer:   Eustaquio Maize [[id number]]   Albuterol HFA every 4 hours as needed for next several dasy Finish antibiotic Delsym cough med OTC Force fluids Avoid smoking RTO prn Out of work till Wednesday  Chevis Pretty, FNP

## 2016-08-25 IMAGING — CR DG ABDOMEN 2V
2 series · 2 of 2 positions shown · non-contrast
Comparison: None.

CLINICAL DATA: Abdomen mass on palpation

EXAM:
ABDOMEN - 2 VIEW

[view not recorded (1 of 2)]
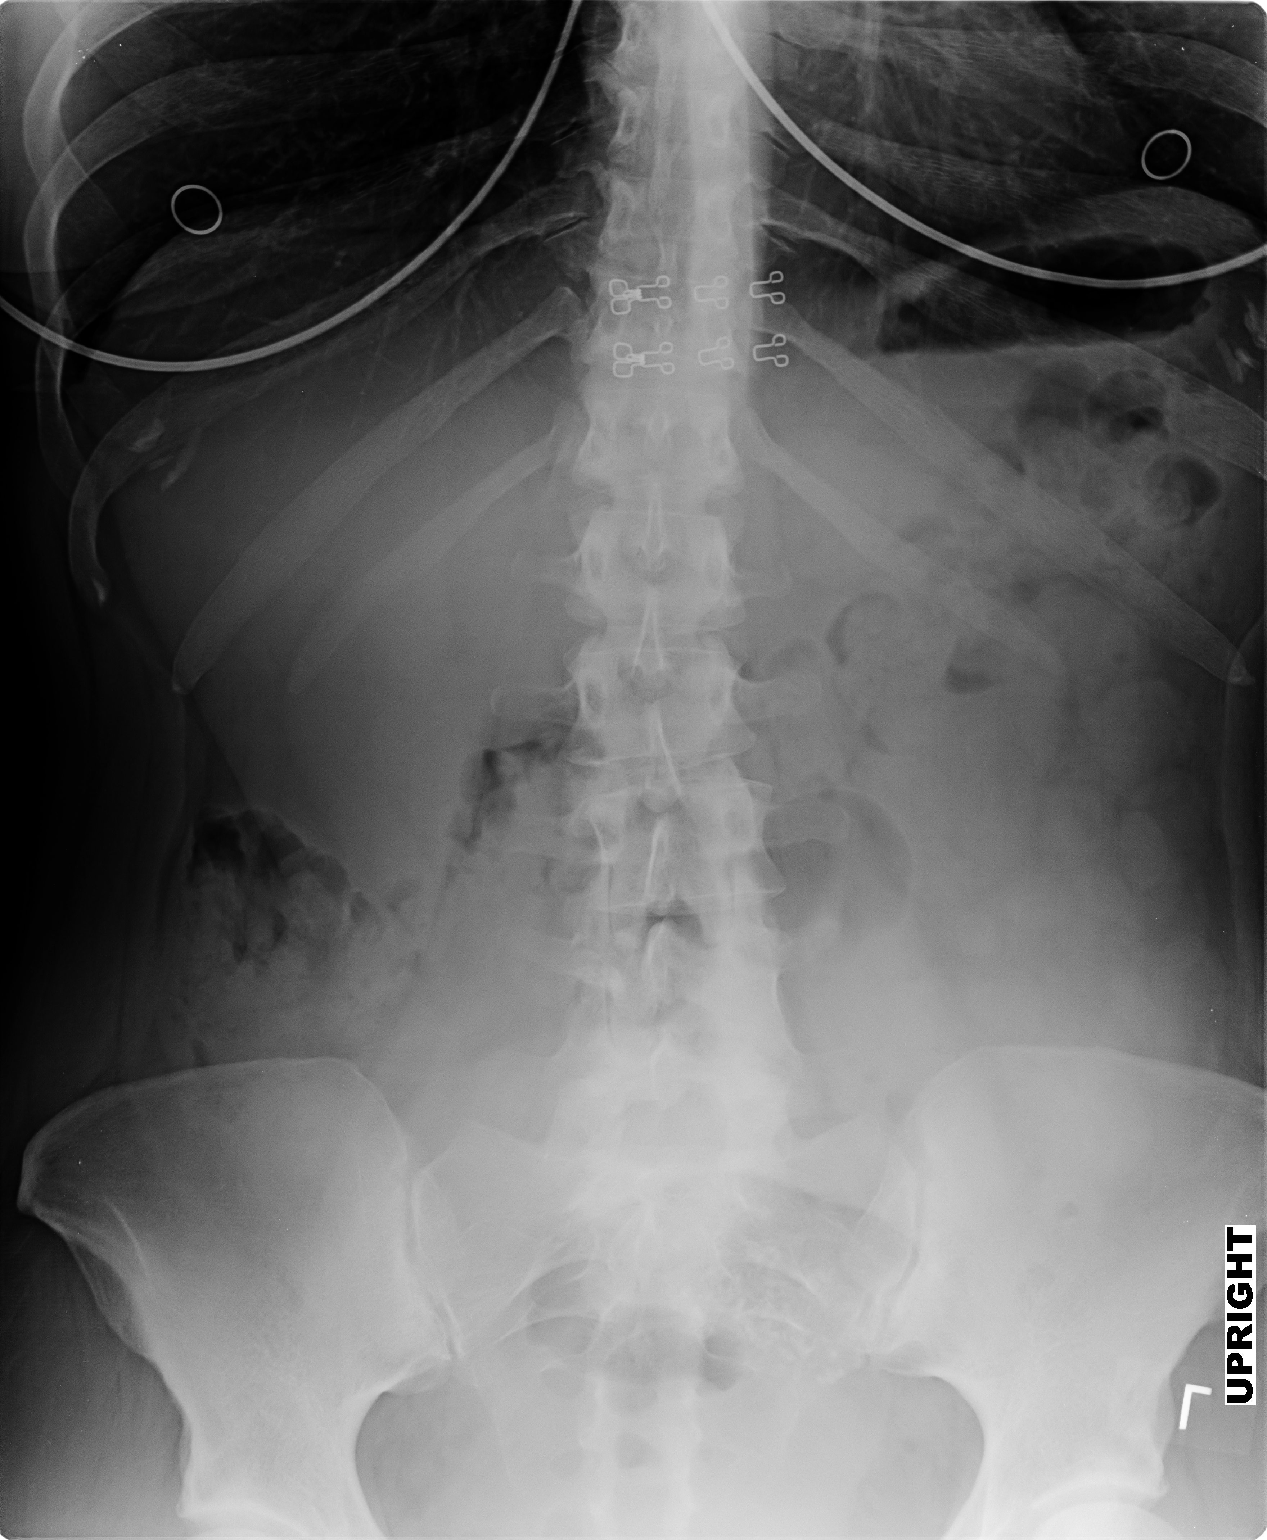

[view not recorded (2 of 2)]
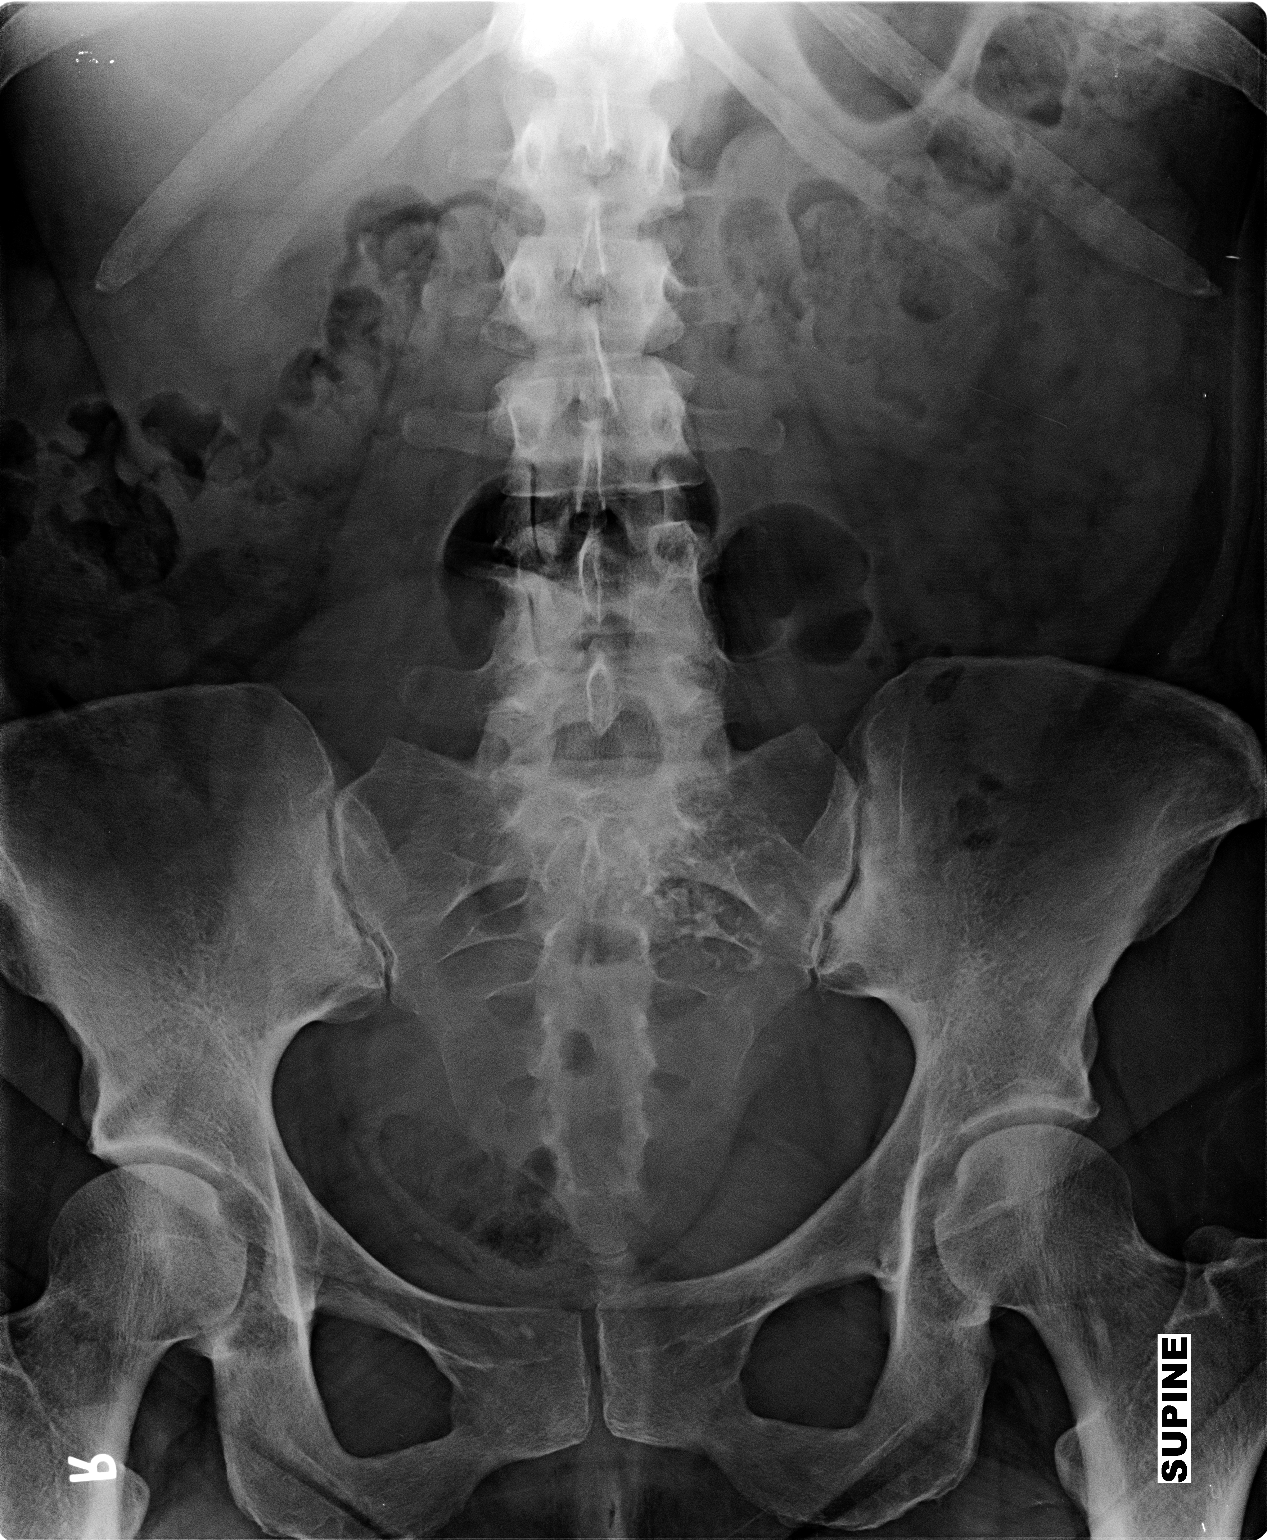

[2 of 2 positions shown; findings below may reference images not displayed]

FINDINGS: The bowel gas pattern is normal. There is no evidence of free air.
Extensive bowel content is identified throughout colon. There is
scoliosis of spine. There is calcified uterine fibroid identified in
the pelvis unchanged compared to prior CT of June 01, 2013.
IMPRESSION: No bowel obstruction. Extensive bowel content identified throughout
colon. Calcification in the pelvis correlating to calcified uterine
fibroid seen on prior CT of June 01, 2013.

## 2016-08-26 ENCOUNTER — Encounter: Payer: Self-pay | Admitting: Nurse Practitioner

## 2016-11-10 ENCOUNTER — Telehealth: Payer: Self-pay | Admitting: Nurse Practitioner

## 2016-11-10 NOTE — Telephone Encounter (Signed)
Ok to give doctor note for those dates

## 2016-11-10 NOTE — Telephone Encounter (Signed)
Disregard my letter written. Letter was written and given to patient earlier today.

## 2016-11-16 ENCOUNTER — Ambulatory Visit (INDEPENDENT_AMBULATORY_CARE_PROVIDER_SITE_OTHER): Payer: PRIVATE HEALTH INSURANCE | Admitting: Nurse Practitioner

## 2016-11-16 ENCOUNTER — Encounter: Payer: Self-pay | Admitting: Nurse Practitioner

## 2016-11-16 VITALS — BP 113/78 | HR 107 | Temp 97.8°F | Ht 66.0 in | Wt 185.0 lb

## 2016-11-16 DIAGNOSIS — L02213 Cutaneous abscess of chest wall: Secondary | ICD-10-CM

## 2016-11-16 MED ORDER — CEPHALEXIN 500 MG PO CAPS: 500.0000 mg | ORAL_CAPSULE | Freq: Four times a day (QID) | ORAL | 0 refills | Status: DC

## 2016-11-16 NOTE — Progress Notes (Signed)
   Subjective:    Patient ID: Lynn Golden, female    DOB: 11/23/1970, 46 y.o.   MRN: [id number]  HPI  Patient comes in by herself today with c/o infected lesion anterior chest wall. It I very common for her to get these. SHe had ome doxycycline at home and started on it Saturday. No drainage as of yet.   Review of Systems  Constitutional: Negative.   Respiratory: Negative.   Cardiovascular: Negative.   Skin:       Lesion anterior chest wall  All other systems reviewed and are negative.      Objective:   Physical Exam  Constitutional: She appears well-developed and well-nourished. No distress.  Cardiovascular: Normal rate and regular rhythm.   Pulmonary/Chest: Effort normal.  Skin: Skin is warm.  3cm erythematous tender lesion anterior chest wall- started draining spontaneously- exsanguinated yellowish foul smelling excudate from wound- cleaned with proxide and dressing applied.   Psychiatric: She has a normal mood and affect. Her behavior is normal. Judgment and thought content normal.   BP 113/78   Pulse (!) 107   Temp 97.8 F (36.6 C) (Oral)   Ht 5\' 6"  (1.676 m)   Wt 185 lb (83.9 kg)   BMI 29.86 kg/m       Assessment & Plan:   1. Abscess of chest wall Clean daily with antibacterial soap Keep covered RTO if not resolving - Anaerobic and Aerobic Culture - cephALEXin (KEFLEX) 500 MG capsule; Take 1 capsule (500 mg total) by mouth 4 (four) times daily.  Dispense: 40 capsule; Refill: 0  Mary-Margaret Hassell Done, FNP

## 2016-11-20 LAB — ANAEROBIC AND AEROBIC CULTURE

## 2016-12-30 ENCOUNTER — Telehealth: Payer: Self-pay | Admitting: Nurse Practitioner

## 2016-12-30 NOTE — Telephone Encounter (Signed)
Faxed last note and labs to Dr. Anthony Sar at [place]

## 2017-03-16 ENCOUNTER — Encounter: Payer: Self-pay | Admitting: Nurse Practitioner

## 2017-03-16 ENCOUNTER — Ambulatory Visit (INDEPENDENT_AMBULATORY_CARE_PROVIDER_SITE_OTHER): Payer: PRIVATE HEALTH INSURANCE | Admitting: Nurse Practitioner

## 2017-03-16 VITALS — BP 124/90 | HR 93 | Temp 97.8°F | Ht 66.0 in | Wt 187.0 lb

## 2017-03-16 DIAGNOSIS — M898X1 Other specified disorders of bone, shoulder: Secondary | ICD-10-CM

## 2017-03-16 DIAGNOSIS — M25512 Pain in left shoulder: Secondary | ICD-10-CM

## 2017-03-16 MED ORDER — NAPROXEN 500 MG PO TABS: 500.0000 mg | ORAL_TABLET | Freq: Two times a day (BID) | ORAL | 1 refills | Status: DC

## 2017-03-16 MED ORDER — CYCLOBENZAPRINE HCL 10 MG PO TABS: 10.0000 mg | ORAL_TABLET | Freq: Three times a day (TID) | ORAL | 1 refills | Status: DC | PRN

## 2017-03-16 NOTE — Progress Notes (Signed)
   Subjective:    Patient ID: Lynn Golden, female    DOB: [birth year], 46 y.o.   MRN: [id number]  HPI Patient in today c/o left shoulder pain and periscapular pain. She fell and landed on left post shoulder and upper back. Heat helps. movement increases pain. Rates pian 6/10.    Review of Systems  Constitutional: Negative.   Respiratory: Negative.   Cardiovascular: Negative.   Musculoskeletal: Positive for arthralgias (left shoulder) and back pain.  Neurological: Negative.   Psychiatric/Behavioral: Negative.   All other systems reviewed and are negative.      Objective:   Physical Exam  Constitutional: She is oriented to person, place, and time. She appears well-developed and well-nourished. No distress.  Cardiovascular: Normal rate and regular rhythm.   Pulmonary/Chest: Effort normal and breath sounds normal.  Musculoskeletal:  Pain with abduction and internal rotation of left shoulder Periscapular pain on palpation. Grips equal bil  Neurological: She is alert and oriented to person, place, and time.  Skin: Skin is warm.  Psychiatric: She has a normal mood and affect. Her behavior is normal. Judgment and thought content normal.   BP 124/90   Pulse 93   Temp 97.8 F (36.6 C) (Oral)   Ht 5\' 6"  (1.676 m)   Wt 187 lb (84.8 kg)   BMI 30.18 kg/m       Assessment & Plan:   1. Acute pain of left shoulder   2. Periscapular pain    Meds ordered this encounter  Medications  . cyclobenzaprine (FLEXERIL) 10 MG tablet    Sig: Take 1 tablet (10 mg total) by mouth 3 (three) times daily as needed for muscle spasms.    Dispense:  30 tablet    Refill:  1    Order Specific Question:   Supervising Provider    Answer:   VINCENT, CAROL L [[id number]]  . naproxen (NAPROSYN) 500 MG tablet    Sig: Take 1 tablet (500 mg total) by mouth 2 (two) times daily with a meal.    Dispense:  60 tablet    Refill:  1    Order Specific Question:   Supervising Provider    Answer:   VINCENT, CAROL L  [[id number]]   Moist heat Rest shoulder RTO prn  Mary-Margaret Hassell Done, FNP

## 2017-04-09 ENCOUNTER — Telehealth: Payer: Self-pay | Admitting: Nurse Practitioner

## 2017-06-09 ENCOUNTER — Other Ambulatory Visit: Payer: Self-pay | Admitting: Nurse Practitioner

## 2017-07-07 ENCOUNTER — Telehealth: Payer: Self-pay

## 2017-07-07 NOTE — Telephone Encounter (Signed)
-----   Message from Chevis Pretty, [place] sent at 07/06/2017  2:11 PM EST ----- Patient needs to schedule mammogram

## 2017-07-07 NOTE — Telephone Encounter (Signed)
LM time to schedule mamo. Call back to reschedule.

## 2017-10-02 IMAGING — US US PELVIS COMPLETE
1 series · 13 of 25 positions shown · non-contrast
Comparison: Noncontrast abdominal and pelvic CT scan dated [place]

CLINICAL DATA: Abdominal pain and known fibroid demonstrated on CT
scan in 8943. Onset of last normal menstrual period was December 03, 2015.

EXAM:
TRANSABDOMINAL ULTRASOUND OF PELVIS
TECHNIQUE: Transabdominal ultrasound examination of the pelvis was performed
including evaluation of the uterus, ovaries, adnexal regions, and
pelvic cul-de-sac.

[Series 1: us pelvis complete · 0.24mm/px · 13 of 51 slices shown]
[im 1/51]
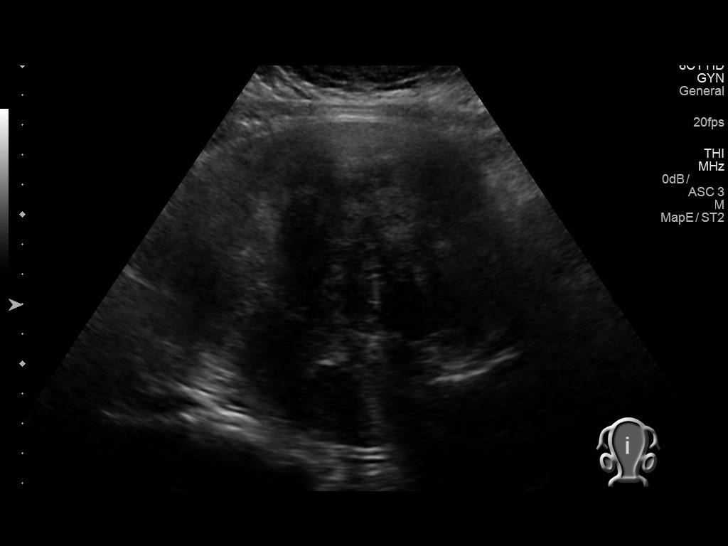
[im 5/51]
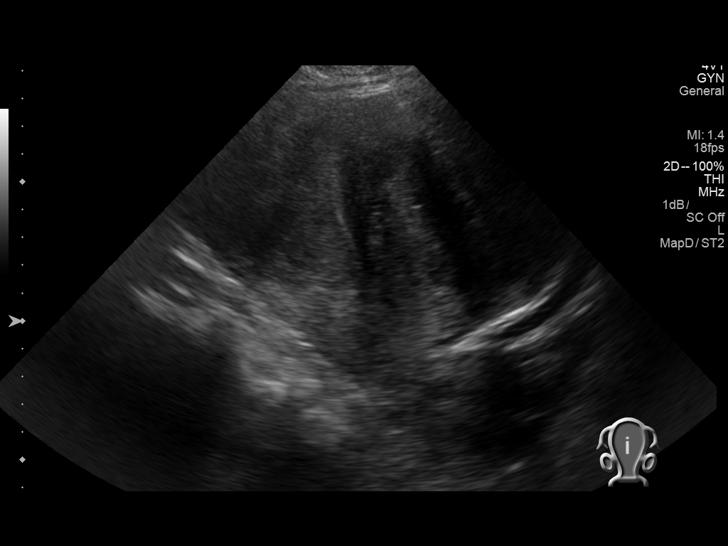
[im 9/51]
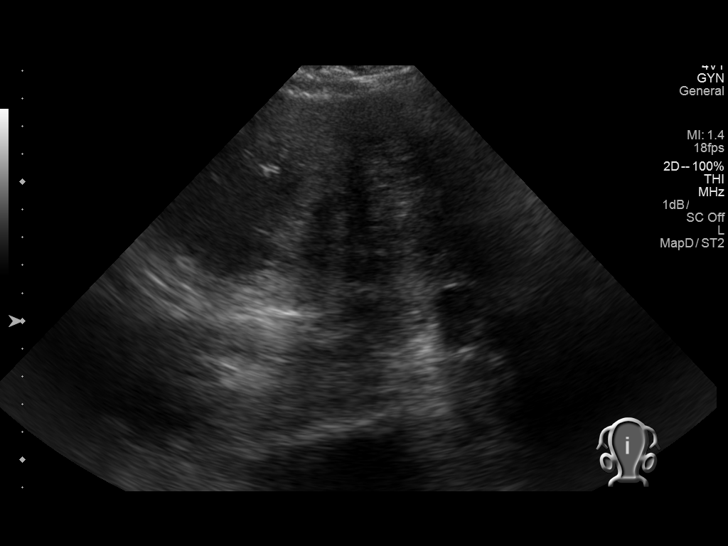
[im 13/51]
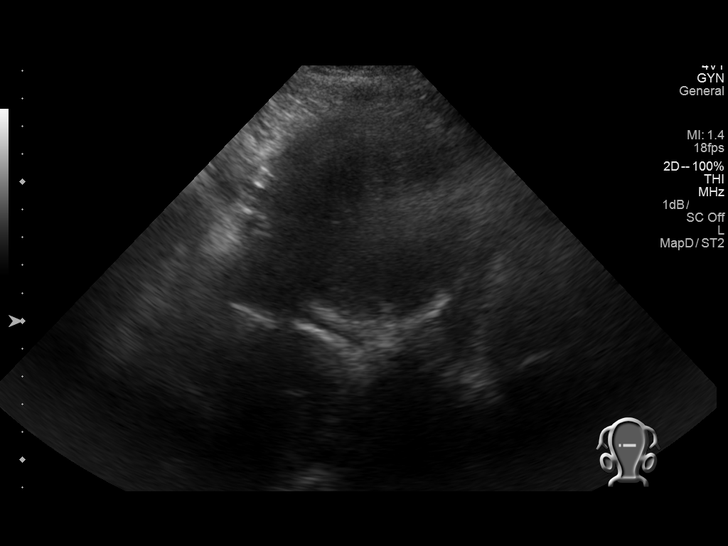
[im 17/51]
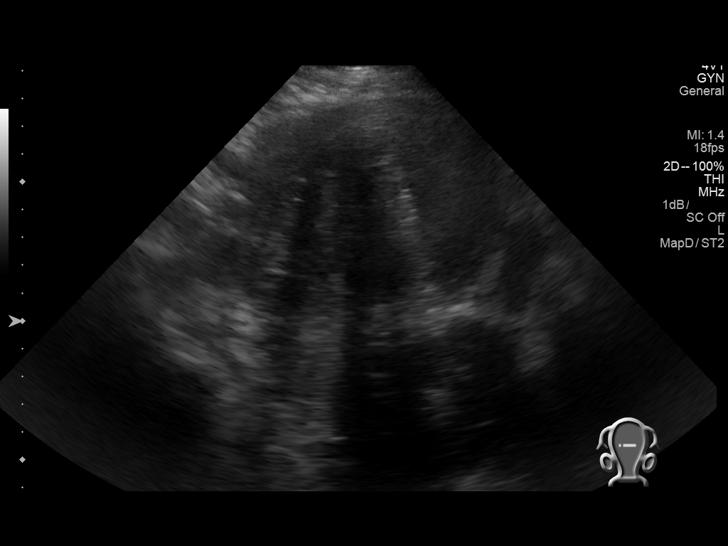
[im 21/51]
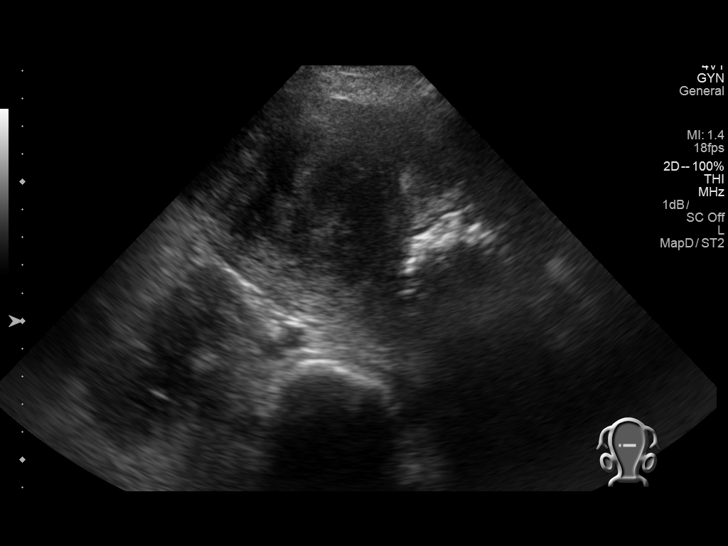
[im 26/51]
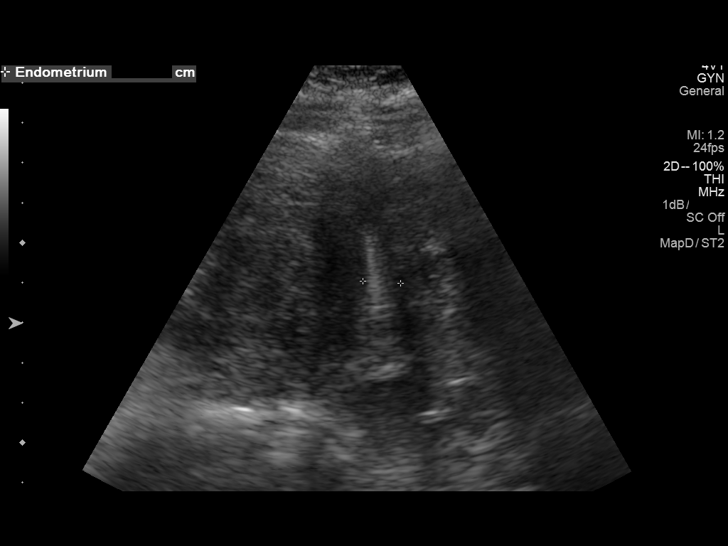
[im 30/51]
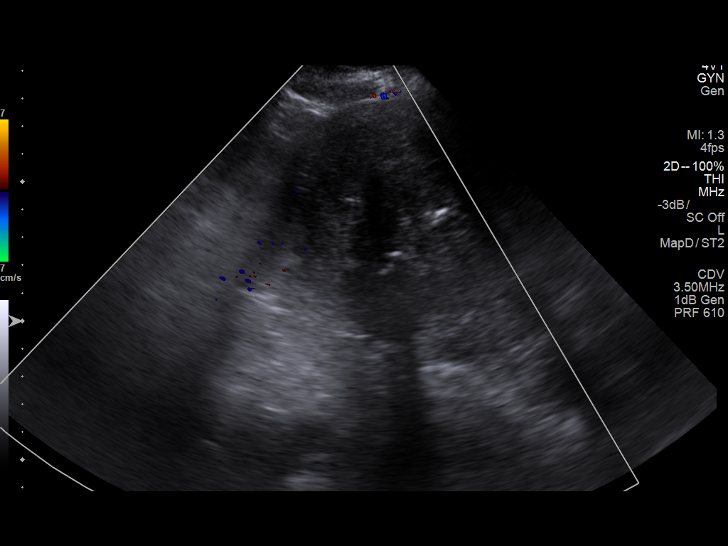
[im 34/51]
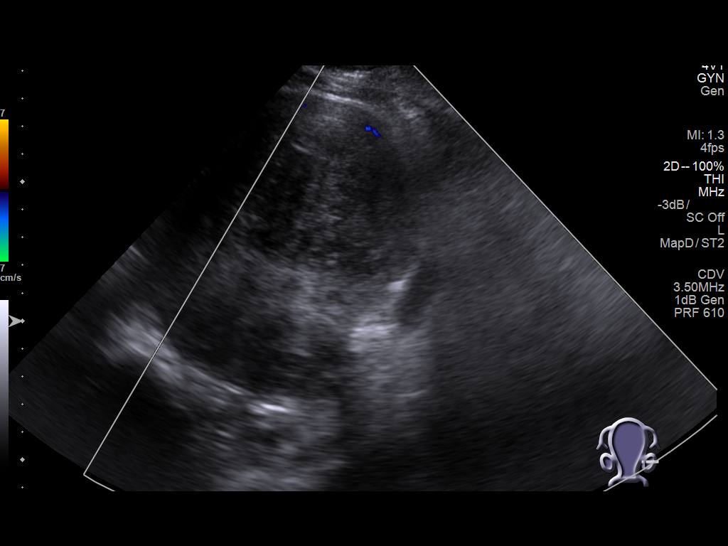
[im 38/51]
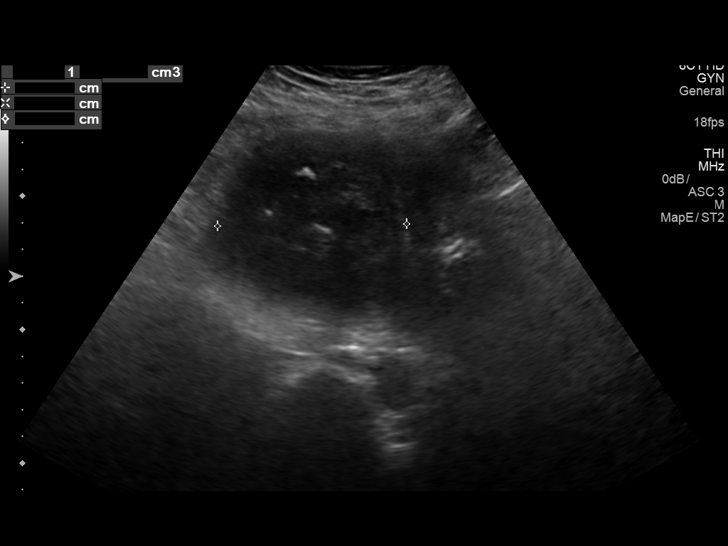
[im 42/51]
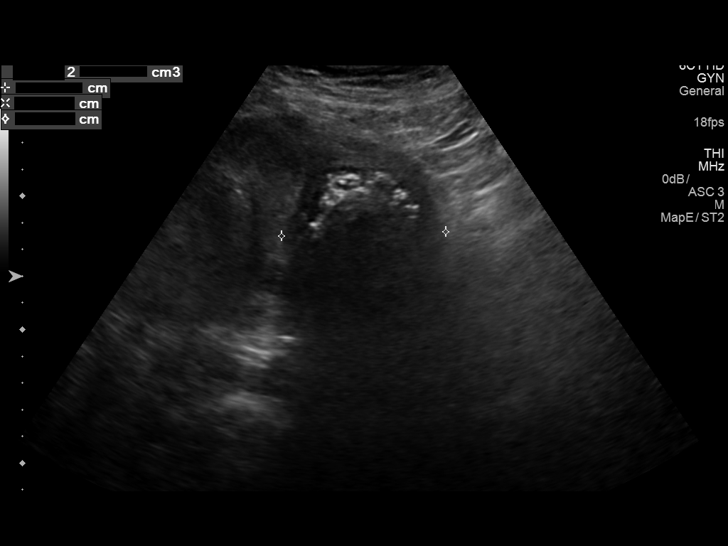
[im 46/51]
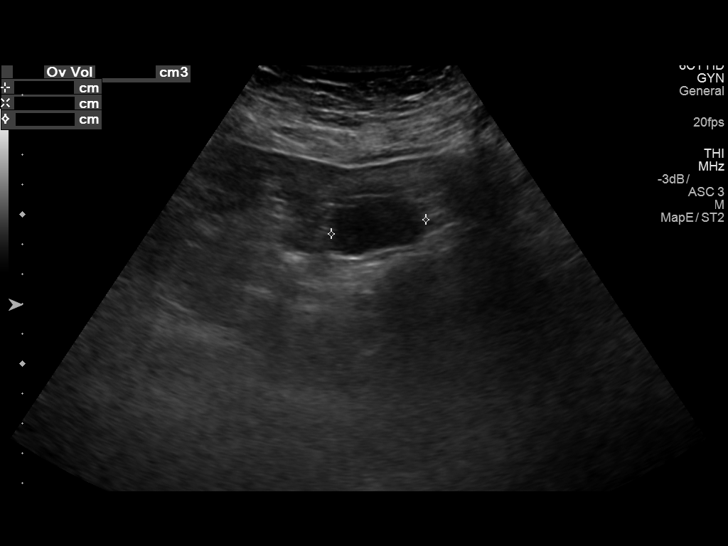
[im 51/51]
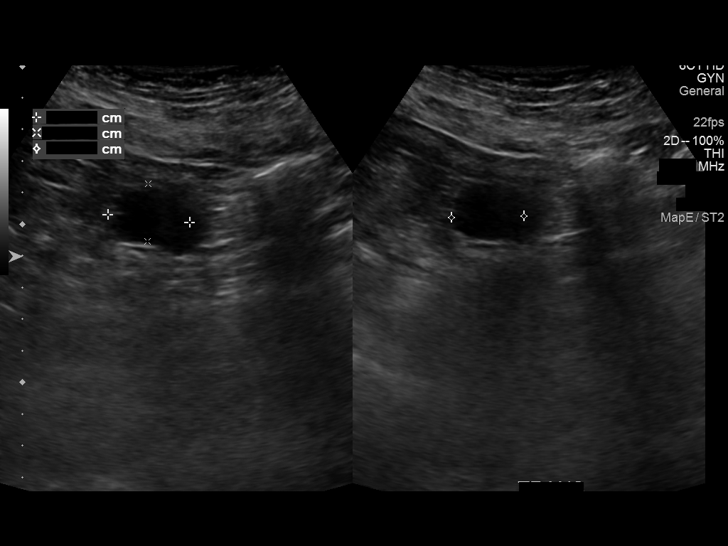

[13 of 25 positions shown; findings below may reference images not displayed]

FINDINGS: Uterus

Measurements: 14.6 x 5.2 x 13.4 cm. Multiple fibroids are observed.
Posterior and to the right in the fundus there is a fibroid
measuring 7.8 x 7 x 7.1 cm. Anteriorly into the left there is a
x 9.0 x 6.2 cm fibroid. Inferiorly just above the cervix there is a
3.8 x 3.2 x 5.1 cm diameter fibroid.

Endometrium

Thickness: 9 mm.  No focal abnormality visualized.

Right ovary

Measurements: 3.1 x 1.4 x 2.7 cm. Normal appearance/no adnexal mass.

Left ovary

Measurements: 3.4 x 2.1 x 3.2 cm. There is a left ovarian cyst
measuring 2.6 x 1.8 x 2.3 cm.

Other findings:  No abnormal free fluid.
IMPRESSION: 1. Enlarged uterus containing multiple fibroids with the largest
lying on the left anteriorly in the fundus measuring 10.1 cm in
greatest dimension.
2. The endometrial stripe is normal in thickness. There are no
abnormal endometrial fluid collections.
3. Simple appearing left ovarian cyst.

## 2017-12-02 ENCOUNTER — Other Ambulatory Visit: Payer: Self-pay | Admitting: Nurse Practitioner

## 2018-01-14 ENCOUNTER — Ambulatory Visit: Payer: PRIVATE HEALTH INSURANCE | Admitting: Nurse Practitioner

## 2018-05-16 ENCOUNTER — Other Ambulatory Visit: Payer: Self-pay | Admitting: Nurse Practitioner

## 2018-05-17 MED ORDER — ALBUTEROL SULFATE HFA 108 (90 BASE) MCG/ACT IN AERS: 2.0000 | INHALATION_SPRAY | Freq: Four times a day (QID) | RESPIRATORY_TRACT | 0 refills | Status: DC | PRN

## 2018-05-17 NOTE — Telephone Encounter (Signed)
Last seen 03/16/17  MMM Needs to be seen 

## 2018-05-17 NOTE — Telephone Encounter (Signed)
Patient does not have insurance at this time

## 2018-05-17 NOTE — Addendum Note (Signed)
Addended by: Chevis Pretty on: 05/17/2018 04:30 PM   Modules accepted: Orders

## 2018-08-29 ENCOUNTER — Other Ambulatory Visit: Payer: Self-pay | Admitting: Nurse Practitioner

## 2018-08-30 ENCOUNTER — Other Ambulatory Visit: Payer: Self-pay | Admitting: Nurse Practitioner

## 2018-08-30 NOTE — Telephone Encounter (Signed)
Last seen 03/16/17  MMM Needs to be seen

## 2018-12-22 ENCOUNTER — Other Ambulatory Visit: Payer: Self-pay

## 2018-12-22 ENCOUNTER — Encounter: Payer: Self-pay | Admitting: Family

## 2018-12-22 ENCOUNTER — Ambulatory Visit (INDEPENDENT_AMBULATORY_CARE_PROVIDER_SITE_OTHER): Payer: PRIVATE HEALTH INSURANCE | Admitting: Family

## 2018-12-22 DIAGNOSIS — H65191 Other acute nonsuppurative otitis media, right ear: Secondary | ICD-10-CM

## 2018-12-22 MED ORDER — AMOXICILLIN 875 MG PO TABS: 875.0000 mg | ORAL_TABLET | Freq: Two times a day (BID) | ORAL | 0 refills | Status: DC

## 2018-12-22 MED ORDER — ALBUTEROL SULFATE HFA 108 (90 BASE) MCG/ACT IN AERS: INHALATION_SPRAY | RESPIRATORY_TRACT | 2 refills | Status: DC

## 2018-12-22 NOTE — Progress Notes (Signed)
   Virtual Visit via telephone Note  I connected with Curtis Sites on 12/22/18 at 3:22 pm by telephone and verified that I am speaking with the correct person using two identifiers. Kailana Benninger is currently located at work and no one is currently with her during visit. The provider, Evelina Dun, FNP is located in their office at time of visit.  I discussed the limitations, risks, security and privacy concerns of performing an evaluation and management service by telephone and the availability of in person appointments. I also discussed with the patient that there may be a patient responsible charge related to this service. The patient expressed understanding and agreed to proceed.   History and Present Illness:  Otalgia   There is pain in the right ear. This is a new problem. The current episode started in the past 7 days. The problem occurs constantly. The problem has been gradually worsening. There has been no fever. The pain is at a severity of 6/10. The pain is moderate. Associated symptoms include hearing loss. Pertinent negatives include no coughing, diarrhea, ear discharge, headaches, rhinorrhea or sore throat. Associated symptoms comments: Had nurse look at it work and stated her TM was erythemas and had fluid . She has tried acetaminophen for the symptoms. The treatment provided mild relief.      Review of Systems  HENT: Positive for ear pain and hearing loss. Negative for ear discharge, rhinorrhea and sore throat.   Respiratory: Negative for cough.   Gastrointestinal: Negative for diarrhea.  Neurological: Negative for headaches.  All other systems reviewed and are negative.    Observations/Objective: No SOB or distress noted  Assessment and Plan: 1. Other non-recurrent acute nonsuppurative otitis media of right ear - Take meds as prescribed - Use a cool mist humidifier  -Use saline nose sprays frequently -Force fluids -For any cough or congestion  Use plain  Mucinex- regular strength or max strength is fine -For fever or aces or pains- take tylenol or ibuprofen. Call the office if symptoms worsen or do not improve  - amoxicillin (AMOXIL) 875 MG tablet; Take 1 tablet (875 mg total) by mouth 2 (two) times daily.  Dispense: 14 tablet; Refill: 0      I discussed the assessment and treatment plan with the patient. The patient was provided an opportunity to ask questions and all were answered. The patient agreed with the plan and demonstrated an understanding of the instructions.   The patient was advised to call back or seek an in-person evaluation if the symptoms worsen or if the condition fails to improve as anticipated.  The above assessment and management plan was discussed with the patient. The patient verbalized understanding of and has agreed to the management plan. Patient is aware to call the clinic if symptoms persist or worsen. Patient is aware when to return to the clinic for a follow-up visit. Patient educated on when it is appropriate to go to the emergency department.   Time call ended: 3:29 pm   I provided 7 minutes of non-face-to-face time during this encounter.    Evelina Dun, FNP

## 2019-07-25 ENCOUNTER — Other Ambulatory Visit: Payer: Self-pay | Admitting: Family

## 2019-09-19 ENCOUNTER — Telehealth: Payer: Self-pay

## 2019-09-19 ENCOUNTER — Other Ambulatory Visit: Payer: Self-pay | Admitting: Family

## 2019-09-19 NOTE — Telephone Encounter (Signed)
Refill request for Albuterol Inhaler denied. Pt has not been seen in the office for COPD since 2018.

## 2019-10-10 ENCOUNTER — Ambulatory Visit (INDEPENDENT_AMBULATORY_CARE_PROVIDER_SITE_OTHER): Payer: Self-pay | Admitting: Nurse Practitioner

## 2019-10-10 ENCOUNTER — Encounter: Payer: Self-pay | Admitting: Nurse Practitioner

## 2019-10-10 DIAGNOSIS — J440 Chronic obstructive pulmonary disease with acute lower respiratory infection: Secondary | ICD-10-CM

## 2019-10-10 DIAGNOSIS — F3342 Major depressive disorder, recurrent, in full remission: Secondary | ICD-10-CM

## 2019-10-10 DIAGNOSIS — J209 Acute bronchitis, unspecified: Secondary | ICD-10-CM

## 2019-10-10 MED ORDER — BUDESONIDE-FORMOTEROL FUMARATE 80-4.5 MCG/ACT IN AERO
2.0000 | INHALATION_SPRAY | Freq: Two times a day (BID) | RESPIRATORY_TRACT | 3 refills | Status: DC
Start: 2019-10-10 — End: 2019-10-10

## 2019-10-10 MED ORDER — FLUTICASONE-SALMETEROL 250-50 MCG/DOSE IN AEPB: 1.0000 | INHALATION_SPRAY | Freq: Two times a day (BID) | RESPIRATORY_TRACT | 3 refills | Status: DC

## 2019-10-10 MED ORDER — ALBUTEROL SULFATE HFA 108 (90 BASE) MCG/ACT IN AERS: INHALATION_SPRAY | RESPIRATORY_TRACT | 0 refills | Status: DC

## 2019-10-10 NOTE — Addendum Note (Signed)
Addended by: Alvine Mostafa, MARY-MARGARET on: 10/10/2019 04:05 PM   Modules accepted: Orders  

## 2019-10-10 NOTE — Progress Notes (Signed)
Virtual Visit via telephone Note Due to COVID-19 pandemic this visit was conducted virtually. This visit type was conducted due to national recommendations for restrictions regarding the COVID-19 Pandemic (e.g. social distancing, sheltering in place) in an effort to limit this patient's exposure and mitigate transmission in our community. All issues noted in this document were discussed and addressed.  A physical exam was not performed with this format.  I connected with Lynn Golden on 10/10/19 at 3:35 by telephone and verified that I am speaking with the correct person using two identifiers. Lynn Golden is currently located at home and her boyfriend is currently with her during visit. The provider, Mary-Margaret Hassell Done, FNP is located in their office at time of visit.  I discussed the limitations, risks, security and privacy concerns of performing an evaluation and management service by telephone and the availability of in person appointments. I also discussed with the patient that there may be a patient responsible charge related to this service. The patient expressed understanding and agreed to proceed.   History and Present Illness:   Chief Complaint: Medical Management of Chronic Issues    HPI:  1. Recurrent major depressive disorder, in full remission [facility]) patient says she is doing well right now. Is currently on no meds.  Depression screen [facility] 2/9 10/10/2019 03/16/2017 08/24/2016  Decreased Interest 0 0 0  Down, Depressed, Hopeless 0 0 0  PHQ - 2 Score 0 0 0     2. COPD (chronic obstructive pulmonary disease) with acute bronchitis ([place]) Still smoke over a 1/2 pack a day. Has occasional cough. She uses her albuterol daily to breathe.is on no other inhalers   Outpatient Encounter Medications as of 10/10/2019  Medication Sig  . albuterol (VENTOLIN HFA) 108 (90 Base) MCG/ACT inhaler USE 2 PUFFS EVERY 6 HOURS AS NEEDED (Needs to be seen before next refill)     Past  Surgical History:  Procedure Laterality Date  . BACK SURGERY    . HERNIA REPAIR    . TUBAL LIGATION      Family History  Problem Relation Age of Onset  . Heart disease Mother   . Cancer Mother        lung    New complaints: None today  Social history: Is a travel nurse for mental health  Controlled substance contract: n/a    Review of Systems  Constitutional: Negative for diaphoresis and weight loss.  Eyes: Negative for blurred vision, double vision and pain.  Respiratory: Positive for shortness of breath.   Cardiovascular: Negative for chest pain, palpitations, orthopnea and leg swelling.  Gastrointestinal: Negative for abdominal pain.  Skin: Negative for rash.  Neurological: Negative for dizziness, sensory change, loss of consciousness, weakness and headaches.  Endo/Heme/Allergies: Negative for polydipsia. Does not bruise/bleed easily.  Psychiatric/Behavioral: Positive for substance abuse (cigarettes). Negative for memory loss. The patient does not have insomnia.   All other systems reviewed and are negative.    Observations/Objective: Alert and oriented- answers all questions appropriately No distress    Assessment and Plan: Lynn Golden in today with chief complaint of Medical Management of Chronic Issues   1. Recurrent major depressive disorder, in full remission ([place]) *2stress management**  2. COPD (chronic obstructive pulmonary disease) with acute bronchitis ([place]) Meds ordered this encounter  Medications  . budesonide-formoterol (SYMBICORT) 80-4.5 MCG/ACT inhaler    Sig: Inhale 2 puffs into the lungs 2 (two) times daily.    Dispense:  1 Inhaler    Refill:  3  Order Specific Question:   Supervising Provider    Answer:   Roseanne Kaufman [[id number]]  . albuterol (VENTOLIN HFA) 108 (90 Base) MCG/ACT inhaler    Sig: USE 2 PUFFS EVERY 6 HOURS AS NEEDED (Needs to be seen before next refill)    Dispense:  6.7 g    Refill:  0    Order Specific Question:    Supervising Provider    Answer:   Roseanne Kaufman [[id number]]   Stop smoking RTO prn    Follow Up Instructions: prn    I discussed the assessment and treatment plan with the patient. The patient was provided an opportunity to ask questions and all were answered. The patient agreed with the plan and demonstrated an understanding of the instructions.   The patient was advised to call back or seek an in-person evaluation if the symptoms worsen or if the condition fails to improve as anticipated.  The above assessment and management plan was discussed with the patient. The patient verbalized understanding of and has agreed to the management plan. Patient is aware to call the clinic if symptoms persist or worsen. Patient is aware when to return to the clinic for a follow-up visit. Patient educated on when it is appropriate to go to the emergency department.   Time call ended:  3:50  I provided 15 minutes of non-face-to-face time during this encounter.    Mary-Margaret Hassell Done, FNP

## 2019-11-07 ENCOUNTER — Other Ambulatory Visit: Payer: Self-pay | Admitting: Nurse Practitioner

## 2019-11-07 DIAGNOSIS — J209 Acute bronchitis, unspecified: Secondary | ICD-10-CM

## 2020-06-07 ENCOUNTER — Other Ambulatory Visit: Payer: Self-pay | Admitting: Nurse Practitioner

## 2020-06-07 DIAGNOSIS — J440 Chronic obstructive pulmonary disease with acute lower respiratory infection: Secondary | ICD-10-CM

## 2020-06-07 DIAGNOSIS — J209 Acute bronchitis, unspecified: Secondary | ICD-10-CM

## 2020-07-22 ENCOUNTER — Other Ambulatory Visit: Payer: Self-pay | Admitting: Nurse Practitioner

## 2020-07-22 ENCOUNTER — Ambulatory Visit (INDEPENDENT_AMBULATORY_CARE_PROVIDER_SITE_OTHER): Payer: Self-pay | Admitting: Nurse Practitioner

## 2020-07-22 ENCOUNTER — Encounter: Payer: Self-pay | Admitting: Nurse Practitioner

## 2020-07-22 DIAGNOSIS — J440 Chronic obstructive pulmonary disease with acute lower respiratory infection: Secondary | ICD-10-CM

## 2020-07-22 DIAGNOSIS — J209 Acute bronchitis, unspecified: Secondary | ICD-10-CM

## 2020-07-22 DIAGNOSIS — K219 Gastro-esophageal reflux disease without esophagitis: Secondary | ICD-10-CM

## 2020-07-22 MED ORDER — PREDNISONE 20 MG PO TABS
ORAL_TABLET | ORAL | 0 refills | Status: DC
Start: 2020-07-22 — End: 2020-09-16

## 2020-07-22 MED ORDER — DOXYCYCLINE HYCLATE 100 MG PO TABS: 100.0000 mg | ORAL_TABLET | Freq: Two times a day (BID) | ORAL | 0 refills | Status: DC

## 2020-07-22 MED ORDER — PANTOPRAZOLE SODIUM 40 MG PO TBEC: 40.0000 mg | DELAYED_RELEASE_TABLET | Freq: Every day | ORAL | 3 refills | Status: DC

## 2020-07-22 NOTE — Progress Notes (Signed)
Virtual Visit via telephone Note Due to COVID-19 pandemic this visit was conducted virtually. This visit type was conducted due to national recommendations for restrictions regarding the COVID-19 Pandemic (e.g. social distancing, sheltering in place) in an effort to limit this patient's exposure and mitigate transmission in our community. All issues noted in this document were discussed and addressed.  A physical exam was not performed with this format.  I connected with Lynn Golden on 07/22/20 at 2:30 by telephone and verified that I am speaking with the correct person using two identifiers. Lynn Golden is currently located at work and none is currently with her during visit. The provider, Mary-Margaret Hassell Done, FNP is located in their office at time of visit.  I discussed the limitations, risks, security and privacy concerns of performing an evaluation and management service by telephone and the availability of in person appointments. I also discussed with the patient that there may be a patient responsible charge related to this service. The patient expressed understanding and agreed to proceed.   History and Present Illness:   Chief Complaint: Sinusitis   HPI Patient calls stating she has developed a cough over the last 5 days. She has not had a fever. She cough has become productive. She is trying to quit smoking. She has done 2 covid tests at work and were negative.  She also is c/o gerd that has worsened. She has tried omperazole with no relief. Someone gave her a protonixand it worked really well.  Review of Systems  Constitutional: Negative for chills, fever and weight loss.  HENT: Positive for congestion and sinus pain. Negative for ear discharge, ear pain and sore throat.   Respiratory: Positive for cough and sputum production (yellow).   Gastrointestinal: Positive for heartburn. Negative for abdominal pain, nausea and vomiting.  Neurological: Positive for headaches.   All other systems reviewed and are negative.    Observations/Objective: Alert and oriented- answers all questions appropriately No distress Voice hoarse Deep wet cough   Assessment and Plan: Lynn Golden in today with chief complaint of Sinusitis   1. Acute bronchitis with COPD ([place]) 1. Take meds as prescribed 2. Use a cool mist humidifier especially during the winter months and when heat has been humid. 3. Use saline nose sprays frequently 4. Saline irrigations of the nose can be very helpful if done frequently.  * 4X daily for 1 week*  * Use of a nettie pot can be helpful with this. Follow directions with this* 5. Drink plenty of fluids 6. Keep thermostat turn down low 7.For any cough or congestion  Use plain Mucinex- regular strength or max strength is fine   * Children- consult with Pharmacist for dosing 8. For fever or aces or pains- take tylenol or ibuprofen appropriate for age and weight.  * for fevers greater than 101 orally you may alternate ibuprofen and tylenol every  3 hours.   Continue to attempt to quit smoking - doxycycline (VIBRA-TABS) 100 MG tablet; Take 1 tablet (100 mg total) by mouth 2 (two) times daily. 1 po bid  Dispense: 20 tablet; Refill: 0 - predniSONE (DELTASONE) 20 MG tablet; 2 po at sametime daily for 5 days  Dispense: 10 tablet; Refill: 0  2. Gastroesophageal reflux disease without esophagitis Avoid spicy foods Do not eat 2 hours prior to bedtime  - pantoprazole (PROTONIX) 40 MG tablet; Take 1 tablet (40 mg total) by mouth daily.  Dispense: 30 tablet; Refill: 3     Follow Up Instructions: prn  I discussed the assessment and treatment plan with the patient. The patient was provided an opportunity to ask questions and all were answered. The patient agreed with the plan and demonstrated an understanding of the instructions.   The patient was advised to call back or seek an in-person evaluation if the symptoms worsen or if the  condition fails to improve as anticipated.  The above assessment and management plan was discussed with the patient. The patient verbalized understanding of and has agreed to the management plan. Patient is aware to call the clinic if symptoms persist or worsen. Patient is aware when to return to the clinic for a follow-up visit. Patient educated on when it is appropriate to go to the emergency department.   Time call ended:  2:43  I provided 13 minutes of non-face-to-face time during this encounter.    Mary-Margaret Hassell Done, FNP

## 2020-09-16 ENCOUNTER — Other Ambulatory Visit: Payer: Self-pay

## 2020-09-16 ENCOUNTER — Ambulatory Visit (INDEPENDENT_AMBULATORY_CARE_PROVIDER_SITE_OTHER): Payer: Self-pay | Admitting: Nurse Practitioner

## 2020-09-16 ENCOUNTER — Other Ambulatory Visit: Payer: Self-pay | Admitting: Nurse Practitioner

## 2020-09-16 ENCOUNTER — Encounter: Payer: Self-pay | Admitting: Nurse Practitioner

## 2020-09-16 VITALS — BP 150/102 | HR 108 | Temp 97.9°F | Resp 20 | Ht 66.0 in | Wt 203.0 lb

## 2020-09-16 DIAGNOSIS — F5101 Primary insomnia: Secondary | ICD-10-CM

## 2020-09-16 DIAGNOSIS — J440 Chronic obstructive pulmonary disease with acute lower respiratory infection: Secondary | ICD-10-CM

## 2020-09-16 DIAGNOSIS — J209 Acute bronchitis, unspecified: Secondary | ICD-10-CM

## 2020-09-16 DIAGNOSIS — I10 Essential (primary) hypertension: Secondary | ICD-10-CM

## 2020-09-16 DIAGNOSIS — E039 Hypothyroidism, unspecified: Secondary | ICD-10-CM

## 2020-09-16 DIAGNOSIS — F3342 Major depressive disorder, recurrent, in full remission: Secondary | ICD-10-CM

## 2020-09-16 DIAGNOSIS — R000 Tachycardia, unspecified: Secondary | ICD-10-CM

## 2020-09-16 DIAGNOSIS — K219 Gastro-esophageal reflux disease without esophagitis: Secondary | ICD-10-CM

## 2020-09-16 MED ORDER — LISINOPRIL 20 MG PO TABS: 20.0000 mg | ORAL_TABLET | Freq: Every day | ORAL | 1 refills | Status: DC

## 2020-09-16 MED ORDER — CITALOPRAM HYDROBROMIDE 20 MG PO TABS: 20.0000 mg | ORAL_TABLET | Freq: Every day | ORAL | 1 refills | Status: DC

## 2020-09-16 MED ORDER — TRAZODONE HCL 50 MG PO TABS: 50.0000 mg | ORAL_TABLET | Freq: Every evening | ORAL | 3 refills | Status: DC | PRN

## 2020-09-16 MED ORDER — FLUTICASONE-SALMETEROL 250-50 MCG/DOSE IN AEPB: 1.0000 | INHALATION_SPRAY | Freq: Two times a day (BID) | RESPIRATORY_TRACT | 3 refills | Status: DC

## 2020-09-16 MED ORDER — PANTOPRAZOLE SODIUM 40 MG PO TBEC: 40.0000 mg | DELAYED_RELEASE_TABLET | Freq: Every day | ORAL | 3 refills | Status: DC

## 2020-09-16 NOTE — Progress Notes (Signed)
Subjective:    Patient ID: Lynn Golden, female    DOB: Sep 18, 1970, 50 y.o.   MRN: [id number]   Chief Complaint: Discuss menopause    HPI:  * patient has been doing traveling nursing. She has now taken a job at [employer], full time but her insurance has not kicked in yet.  1. COPD (chronic obstructive pulmonary disease) with acute bronchitis ([facility]) Has constant SOB . She is down to 5 cigarettes a day. Has tried nicotine patches and they made her nauseaous. She is still on advair daily which helps.  2. Recurrent major depressive disorder, in full remission ([place]ynn Golden) Has been depressed in the past. Is currently on no medications. shasys she does stay down all the time. Depression screen [facility] 2/9 09/16/2020 10/10/2019 03/16/2017  Decreased Interest 2 0 0  Down, Depressed, Hopeless 3 0 0  PHQ - 2 Score 5 0 0  Altered sleeping 3 - -  Tired, decreased energy 3 - -  Change in appetite 0 - -  Feeling bad or failure about yourself  2 - -  Trouble concentrating 2 - -  Moving slowly or fidgety/restless 0 - -  Suicidal thoughts 0 - -  PHQ-9 Score 15 - -  Difficult doing work/chores Not difficult at all - -    3. GERD without esophagitis Is on protonix which works well for her symptoms.    Outpatient Encounter Medications as of 09/16/2020  Medication Sig  . albuterol (VENTOLIN HFA) 108 (90 Base) MCG/ACT inhaler USE 2 PUFFS EVERY 6 HOURS AS NEEDED  . Fluticasone-Salmeterol (ADVAIR DISKUS) 250-50 MCG/DOSE AEPB Inhale 1 puff into the lungs 2 (two) times daily.  . pantoprazole (PROTONIX) 40 MG tablet Take 1 tablet (40 mg total) by mouth daily.  . [DISCONTINUED] doxycycline (VIBRA-TABS) 100 MG tablet Take 1 tablet (100 mg total) by mouth 2 (two) times daily. 1 po bid  . [DISCONTINUED] predniSONE (DELTASONE) 20 MG tablet 2 po at sametime daily for 5 days   No facility-administered encounter medications on file as of 09/16/2020.    Past Surgical History:  Procedure Laterality Date  . BACK  SURGERY    . HERNIA REPAIR    . TUBAL LIGATION      Family History  Problem Relation Age of Onset  . Heart disease Mother   . Cancer Mother        lung    New complaints: - thinks she is menopausal. Has not had a period in 3 months. She is having hot flashes, heart rate elevated and tired all the time. She has been on black koash for 6 months and that has not helped. - blood pressure has been elevated at work- 132 systolic.  Social history: Lives with her husband.  Controlled substance contract: n/a    Review of Systems  Constitutional: Negative for diaphoresis.  Eyes: Negative for pain.  Respiratory: Positive for shortness of breath.   Cardiovascular: Negative for chest pain, palpitations and leg swelling.  Gastrointestinal: Negative for abdominal pain.  Endocrine: Negative for polydipsia.  Skin: Negative for rash.  Neurological: Negative for dizziness, weakness and headaches.  Hematological: Does not bruise/bleed easily.  Psychiatric/Behavioral: The patient is nervous/anxious.   All other systems reviewed and are negative.      Objective:   Physical Exam Vitals and nursing note reviewed.  Constitutional:      General: She is not in acute distress.    Appearance: Normal appearance. She is well-developed and well-nourished.  HENT:  Head: Normocephalic.     Nose: Nose normal.     Mouth/Throat:     Mouth: Oropharynx is clear and moist.  Eyes:     Extraocular Movements: EOM normal.     Pupils: Pupils are equal, round, and reactive to light.  Neck:     Vascular: No carotid bruit or JVD.  Cardiovascular:     Rate and Rhythm: Normal rate and regular rhythm.     Pulses: Intact distal pulses.     Heart sounds: Normal heart sounds.  Pulmonary:     Effort: Pulmonary effort is normal. No respiratory distress.     Breath sounds: Normal breath sounds. No wheezing or rales.  Chest:     Chest wall: No tenderness.  Abdominal:     General: Bowel sounds are normal.  There is no distension or abdominal bruit. Aorta is normal.     Palpations: Abdomen is soft. There is no hepatomegaly, splenomegaly, mass or pulsatile mass.     Tenderness: There is no abdominal tenderness.  Musculoskeletal:        General: No edema. Normal range of motion.     Cervical back: Normal range of motion and neck supple.  Lymphadenopathy:     Cervical: No cervical adenopathy.  Skin:    General: Skin is warm and dry.  Neurological:     Mental Status: She is alert and oriented to person, place, and time.     Deep Tendon Reflexes: Reflexes are normal and symmetric.  Psychiatric:        Mood and Affect: Mood and affect normal.        Behavior: Behavior normal.        Thought Content: Thought content normal.        Judgment: Judgment normal.    BP (!) 150/102   Pulse (!) 108   Temp 97.9 F (36.6 C) (Temporal)   Resp 20   Ht '5\' 6"'  (1.676 m)   Wt 203 lb (92.1 kg)   SpO2 98%   BMI 32.77 kg/m          Assessment & Plan:   Karyme Mcconathy in today with chief complaint of Discuss menopause   1. COPD (chronic obstructive pulmonary disease) with acute bronchitis ([facility]) Continue advair contniue smoking cesation - Fluticasone-Salmeterol (ADVAIR DISKUS) 250-50 MCG/DOSE AEPB; Inhale 1 puff into the lungs 2 (two) times daily.  Dispense: 1 each; Refill: 3  2. Recurrent major depressive disorder, in full remission (Lynn Golden) Stress management - citalopram (CELEXA) 20 MG tablet; Take 1 tablet (20 mg total) by mouth daily.  Dispense: 90 tablet; Refill: 1  3. GERD without esophagitis Discussed diet and exercise for person with BMI >25 Will recheck weight in 3-6 months - pantoprazole (PROTONIX) 40 MG tablet; Take 1 tablet (40 mg total) by mouth daily.  Dispense: 30 tablet; Refill: 3   4. Primary hypertension Low sodium diet - lisinopril (ZESTRIL) 20 MG tablet; Take 1 tablet (20 mg total) by mouth daily.  Dispense: 90 tablet; Refill: 1  5. Primary insomnia Bedtime routine -  traZODone (DESYREL) 50 MG tablet; Take 1-2 tablets (50-100 mg total) by mouth at bedtime as needed for sleep.  Dispense: 60 tablet; Refill: 3 - CMP14+EGFR  6. Tachycardia Labs pending - Thyroid Panel With TSH     The above assessment and management plan was discussed with the patient. The patient verbalized understanding of and has agreed to the management plan. Patient is aware to call the clinic if symptoms persist or  worsen. Patient is aware when to return to the clinic for a follow-up visit. Patient educated on when it is appropriate to go to the emergency department.   Mary-Margaret Hassell Done, FNP

## 2020-09-16 NOTE — Patient Instructions (Signed)

## 2020-09-17 LAB — THYROID PANEL WITH TSH
Free Thyroxine Index: 1.1 — ABNORMAL LOW (ref 1.2–4.9)
T3 Uptake Ratio: 25 % (ref 24–39)
T4, Total: 4.3 ug/dL — ABNORMAL LOW (ref 4.5–12.0)
TSH: 7.55 u[IU]/mL — ABNORMAL HIGH (ref 0.450–4.500)

## 2020-09-17 LAB — CMP14+EGFR
ALT: 17 IU/L (ref 0–32)
AST: 16 IU/L (ref 0–40)
Albumin/Globulin Ratio: 1.7 (ref 1.2–2.2)
Albumin: 4.5 g/dL (ref 3.8–4.8)
Alkaline Phosphatase: 101 IU/L (ref 44–121)
BUN/Creatinine Ratio: 9 (ref 9–23)
BUN: 9 mg/dL (ref 6–24)
Bilirubin Total: 0.2 mg/dL (ref 0.0–1.2)
CO2: 21 mmol/L (ref 20–29)
Calcium: 9.7 mg/dL (ref 8.7–10.2)
Chloride: 98 mmol/L (ref 96–106)
Creatinine, Ser: 0.96 mg/dL (ref 0.57–1.00)
GFR calc Af Amer: 81 mL/min/{1.73_m2} (ref >59)
GFR calc non Af Amer: 70 mL/min/{1.73_m2} (ref >59)
Globulin, Total: 2.7 g/dL (ref 1.5–4.5)
Glucose: 99 mg/dL (ref 65–99)
Potassium: 5.1 mmol/L (ref 3.5–5.2)
Sodium: 137 mmol/L (ref 134–144)
Total Protein: 7.2 g/dL (ref 6.0–8.5)

## 2020-09-17 MED ORDER — LEVOTHYROXINE SODIUM 50 MCG PO TABS: 50.0000 ug | ORAL_TABLET | Freq: Every day | ORAL | 1 refills | Status: DC

## 2020-09-17 NOTE — Addendum Note (Signed)
Addended by: Chevis Pretty on: 09/17/2020 04:30 PM   Modules accepted: Orders

## 2020-09-20 ENCOUNTER — Telehealth: Payer: Self-pay

## 2020-09-20 NOTE — Telephone Encounter (Signed)
Do you want to see patient in 8 weeks or does she just need labs repeated?

## 2020-09-20 NOTE — Telephone Encounter (Signed)
Labs repeated

## 2020-09-23 NOTE — Telephone Encounter (Signed)
Left detailed message on patients voicemail to stop by and have labs drawn without an appt

## 2020-11-19 ENCOUNTER — Other Ambulatory Visit: Payer: Self-pay

## 2020-11-19 DIAGNOSIS — E039 Hypothyroidism, unspecified: Secondary | ICD-10-CM

## 2020-11-20 LAB — THYROID PANEL WITH TSH
Free Thyroxine Index: 1.7 (ref 1.2–4.9)
T3 Uptake Ratio: 26 % (ref 24–39)
T4, Total: 6.6 ug/dL (ref 4.5–12.0)
TSH: 5.23 u[IU]/mL — ABNORMAL HIGH (ref 0.450–4.500)

## 2020-11-21 ENCOUNTER — Other Ambulatory Visit: Payer: Self-pay

## 2020-11-21 ENCOUNTER — Encounter: Payer: Self-pay | Admitting: Nurse Practitioner

## 2020-11-21 ENCOUNTER — Ambulatory Visit (INDEPENDENT_AMBULATORY_CARE_PROVIDER_SITE_OTHER): Payer: Self-pay | Admitting: Nurse Practitioner

## 2020-11-21 VITALS — BP 168/105 | HR 81 | Temp 98.3°F | Resp 20 | Ht 66.0 in | Wt 210.0 lb

## 2020-11-21 DIAGNOSIS — F5101 Primary insomnia: Secondary | ICD-10-CM | POA: Insufficient documentation

## 2020-11-21 DIAGNOSIS — J209 Acute bronchitis, unspecified: Secondary | ICD-10-CM

## 2020-11-21 DIAGNOSIS — F3342 Major depressive disorder, recurrent, in full remission: Secondary | ICD-10-CM

## 2020-11-21 DIAGNOSIS — K219 Gastro-esophageal reflux disease without esophagitis: Secondary | ICD-10-CM

## 2020-11-21 DIAGNOSIS — I10 Essential (primary) hypertension: Secondary | ICD-10-CM

## 2020-11-21 DIAGNOSIS — R609 Edema, unspecified: Secondary | ICD-10-CM

## 2020-11-21 DIAGNOSIS — J440 Chronic obstructive pulmonary disease with acute lower respiratory infection: Secondary | ICD-10-CM

## 2020-11-21 DIAGNOSIS — E039 Hypothyroidism, unspecified: Secondary | ICD-10-CM

## 2020-11-21 MED ORDER — PANTOPRAZOLE SODIUM 40 MG PO TBEC: 40.0000 mg | DELAYED_RELEASE_TABLET | Freq: Every day | ORAL | 1 refills | Status: DC

## 2020-11-21 MED ORDER — FLUTICASONE-SALMETEROL 250-50 MCG/DOSE IN AEPB: 1.0000 | INHALATION_SPRAY | Freq: Two times a day (BID) | RESPIRATORY_TRACT | 3 refills | Status: DC

## 2020-11-21 MED ORDER — LISINOPRIL 40 MG PO TABS: 40.0000 mg | ORAL_TABLET | Freq: Every day | ORAL | 1 refills | Status: DC

## 2020-11-21 MED ORDER — FUROSEMIDE 20 MG PO TABS
20.0000 mg | ORAL_TABLET | Freq: Every day | ORAL | 3 refills | Status: DC
Start: 2020-11-21 — End: 2021-04-15

## 2020-11-21 MED ORDER — TRAZODONE HCL 50 MG PO TABS: 50.0000 mg | ORAL_TABLET | Freq: Every evening | ORAL | 5 refills | Status: DC | PRN

## 2020-11-21 MED ORDER — LEVOTHYROXINE SODIUM 75 MCG PO TABS: 75.0000 ug | ORAL_TABLET | Freq: Every day | ORAL | 1 refills | Status: DC

## 2020-11-21 NOTE — Patient Instructions (Signed)
Managing the Challenge of Quitting Smoking Quitting smoking is a physical and mental challenge. You will face cravings, withdrawal symptoms, and temptation. Before quitting, work with your health care provider to make a plan that can help you manage quitting. Preparation can help you quit and keep you from giving in. How to manage lifestyle changes Managing stress Stress can make you want to smoke, and wanting to smoke may cause stress. It is important to find ways to manage your stress. You might try some of the following:  Practice relaxation techniques. ? Breathe slowly and deeply, in through your nose and out through your mouth. ? Listen to music. ? Soak in a bath or take a shower. ? Imagine a peaceful place or vacation.  Get some support. ? Talk with family or friends about your stress. ? Join a support group. ? Talk with a counselor or therapist.  Get some physical activity. ? Go for a walk, run, or bike ride. ? Play a favorite sport. ? Practice yoga.   Medicines Talk with your health care provider about medicines that might help you deal with cravings and make quitting easier for you. Relationships Social situations can be difficult when you are quitting smoking. To manage this, you can:  Avoid parties and other social situations where people might be smoking.  Avoid alcohol.  Leave right away if you have the urge to smoke.  Explain to your family and friends that you are quitting smoking. Ask for support and let them know you might be a bit grumpy.  Plan activities where smoking is not an option. General instructions Be aware that many people gain weight after they quit smoking. However, not everyone does. To keep from gaining weight, have a plan in place before you quit and stick to the plan after you quit. Your plan should include:  Having healthy snacks. When you have a craving, it may help to: ? Eat popcorn, carrots, celery, or other cut vegetables. ? Chew  sugar-free gum.  Changing how you eat. ? Eat small portion sizes at meals. ? Eat 4-6 small meals throughout the day instead of 1-2 large meals a day. ? Be mindful when you eat. Do not watch television or do other things that might distract you as you eat.  Exercising regularly. ? Make time to exercise each day. If you do not have time for a long workout, do short bouts of exercise for 5-10 minutes several times a day. ? Do some form of strengthening exercise, such as weight lifting. ? Do some exercise that gets your heart beating and causes you to breathe deeply, such as walking fast, running, swimming, or biking. This is very important.  Drinking plenty of water or other low-calorie or no-calorie drinks. Drink 6-8 glasses of water daily.   How to recognize withdrawal symptoms Your body and mind may experience discomfort as you try to get used to not having nicotine in your system. These effects are called withdrawal symptoms. They may include:  Feeling hungrier than normal.  Having trouble concentrating.  Feeling irritable or restless.  Having trouble sleeping.  Feeling depressed.  Craving a cigarette. To manage withdrawal symptoms:  Avoid places, people, and activities that trigger your cravings.  Remember why you want to quit.  Get plenty of sleep.  Avoid coffee and other caffeinated drinks. These may worsen some of your symptoms. These symptoms may surprise you. But be assured that they are normal to have when quitting smoking. How to manage cravings   Come up with a plan for how to deal with your cravings. The plan should include the following:  A definition of the specific situation you want to deal with.  An alternative action you will take.  A clear idea for how this action will help.  The name of someone who might help you with this. Cravings usually last for 5-10 minutes. Consider taking the following actions to help you with your plan to deal with  cravings:  Keep your mouth busy. ? Chew sugar-free gum. ? Suck on hard candies or a straw. ? Brush your teeth.  Keep your hands and body busy. ? Change to a different activity right away. ? Squeeze or play with a ball. ? Do an activity or a hobby, such as making bead jewelry, practicing needlepoint, or working with wood. ? Mix up your normal routine. ? Take a short exercise break. Go for a quick walk or run up and down stairs.  Focus on doing something kind or helpful for someone else.  Call a friend or family member to talk during a craving.  Join a support group.  Contact a quitline. Where to find support To get help or find a support group:  Call the National Cancer Institute's Smoking Quitline: 1-800-QUIT NOW (784-8669)  Visit the website of the Substance Abuse and Mental Health Services Administration: www.samhsa.gov  Text QUIT to SmokefreeTXT: 478848 Where to find more information Visit these websites to find more information on quitting smoking:  National Cancer Institute: www.smokefree.gov  American Lung Association: www.lung.org  American Cancer Society: www.cancer.org  Centers for Disease Control and Prevention: www.cdc.gov  American Heart Association: www.heart.org Contact a health care provider if:  You want to change your plan for quitting.  The medicines you are taking are not helping.  Your eating feels out of control or you cannot sleep. Get help right away if:  You feel depressed or become very anxious. Summary  Quitting smoking is a physical and mental challenge. You will face cravings, withdrawal symptoms, and temptation to smoke again. Preparation can help you as you go through these challenges.  Try different techniques to manage stress, handle social situations, and prevent weight gain.  You can deal with cravings by keeping your mouth busy (such as by chewing gum), keeping your hands and body busy, calling family or friends, or  contacting a quitline for people who want to quit smoking.  You can deal with withdrawal symptoms by avoiding places where people smoke, getting plenty of rest, and avoiding drinks with caffeine. This information is not intended to replace advice given to you by your health care provider. Make sure you discuss any questions you have with your health care provider. Document Revised: 05/09/2019 Document Reviewed: 05/09/2019 Elsevier Patient Education  2021 Elsevier Inc.  

## 2020-11-21 NOTE — Progress Notes (Signed)
Subjective:    Patient ID: Lynn Golden, female    DOB: 19-Mar-1971, 50 y.o.   MRN: [id number]   Chief Complaint: Discuss thyroid and meds    HPI:  1. GERD without esophagitis Is on protonix and that is working well for her.  2. COPD (chronic obstructive pulmonary disease) with acute bronchitis ([place]) Is on advair daily and is doing well. She is still smoking about 6-7 cigarettes a day. o wheezing or cough. Uses albuterol at least 1 a day.  3. Recurrent major depressive disorder, in full remission ([place]) Is on celexa and that is working well for her. Depression screen [facility] 2/9 11/21/2020 09/16/2020 10/10/2019  Decreased Interest 0 2 0  Down, Depressed, Hopeless 0 3 0  PHQ - 2 Score 0 5 0  Altered sleeping - 3 -  Tired, decreased energy - 3 -  Change in appetite - 0 -  Feeling bad or failure about yourself  - 2 -  Trouble concentrating - 2 -  Moving slowly or fidgety/restless - 0 -  Suicidal thoughts - 0 -  PHQ-9 Score - 15 -  Difficult doing work/chores - Not difficult at all -     4. Primary hypertension She was put on lisinopril and has been checking her blood pressure at home and it has been in the 390-300'P systolic. Se has also started have lower ext edema.  5. Primary insomnia Is on trazadone to sleep. Has to take 2 at night and sleeps about 5-6 hours.    Outpatient Encounter Medications as of 11/21/2020  Medication Sig  . citalopram (CELEXA) 20 MG tablet Take 1 tablet (20 mg total) by mouth daily.  Marland Kitchen levothyroxine (SYNTHROID) 50 MCG tablet Take 1 tablet (50 mcg total) by mouth daily.  Marland Kitchen lisinopril (ZESTRIL) 20 MG tablet Take 1 tablet (20 mg total) by mouth daily.  . pantoprazole (PROTONIX) 40 MG tablet Take 1 tablet (40 mg total) by mouth daily.  Marland Kitchen albuterol (VENTOLIN HFA) 108 (90 Base) MCG/ACT inhaler USE 2 PUFFS EVERY 6 HOURS AS NEEDED (Patient not taking: Reported on 11/21/2020)  . Fluticasone-Salmeterol (ADVAIR DISKUS) 250-50 MCG/DOSE AEPB Inhale 1 puff into  the lungs 2 (two) times daily. (Patient not taking: Reported on 11/21/2020)  . traZODone (DESYREL) 50 MG tablet Take 1-2 tablets (50-100 mg total) by mouth at bedtime as needed for sleep.   No facility-administered encounter medications on file as of 11/21/2020.    Past Surgical History:  Procedure Laterality Date  . BACK SURGERY    . HERNIA REPAIR    . TUBAL LIGATION      Family History  Problem Relation Age of Onset  . Heart disease Mother   . Cancer Mother        lung    New complaints: Nothing new other then what is stated above  Social history: Lives with husband  Controlled substance contract: *n/a**    Review of Systems  Constitutional: Negative for diaphoresis.  Eyes: Negative for pain.  Respiratory: Negative for shortness of breath.   Cardiovascular: Positive for leg swelling. Negative for chest pain and palpitations.  Gastrointestinal: Negative for abdominal pain.  Endocrine: Negative for polydipsia.  Skin: Negative for rash.  Neurological: Negative for dizziness, weakness and headaches.  Hematological: Does not bruise/bleed easily.  All other systems reviewed and are negative.      Objective:   Physical Exam Vitals and nursing note reviewed.  Constitutional:      General: She is not in acute distress.  Appearance: Normal appearance. She is well-developed.  HENT:     Head: Normocephalic.     Nose: Nose normal.  Eyes:     Pupils: Pupils are equal, round, and reactive to light.  Neck:     Vascular: No carotid bruit or JVD.  Cardiovascular:     Rate and Rhythm: Normal rate and regular rhythm.     Heart sounds: Normal heart sounds.  Pulmonary:     Effort: Pulmonary effort is normal. No respiratory distress.     Breath sounds: Wheezing (inspiratry and expiratory) present. No rales.  Chest:     Chest wall: No tenderness.  Abdominal:     General: Bowel sounds are normal. There is no distension or abdominal bruit.     Palpations: Abdomen is soft.  There is no hepatomegaly, splenomegaly, mass or pulsatile mass.     Tenderness: There is no abdominal tenderness.  Musculoskeletal:        General: Normal range of motion.     Cervical back: Normal range of motion and neck supple.  Lymphadenopathy:     Cervical: No cervical adenopathy.  Skin:    General: Skin is warm and dry.  Neurological:     Mental Status: She is alert and oriented to person, place, and time.     Deep Tendon Reflexes: Reflexes are normal and symmetric.  Psychiatric:        Behavior: Behavior normal.        Thought Content: Thought content normal.        Judgment: Judgment normal.      BP (!) 168/105   Pulse 81   Temp 98.3 F (36.8 C) (Temporal)   Resp 20   Ht '5\' 6"'  (1.676 m)   Wt 210 lb (95.3 kg)   SpO2 97%   BMI 33.89 kg/m       Assessment & Plan:  Lynn Golden comes in today with chief complaint of Discuss thyroid and meds   Diagnosis and orders addressed:  1. GERD without esophagitis Avoid spicy foods Do not eat 2 hours prior to bedtime - pantoprazole (PROTONIX) 40 MG tablet; Take 1 tablet (40 mg total) by mouth daily.  Dispense: 90 tablet; Refill: 1  2. COPD (chronic obstructive pulmonary disease) with acute bronchitis ([facility]) Smoking cessation encouraged - Fluticasone-Salmeterol (ADVAIR DISKUS) 250-50 MCG/DOSE AEPB; Inhale 1 puff into the lungs 2 (two) times daily.  Dispense: 1 each; Refill: 3  3. Recurrent major depressive disorder, in full remission [facility]) Stress management Continue celexa as prescribed  4. Primary hypertension Low sodium diet - lisinopril (ZESTRIL) 40 MG tablet; Take 1 tablet (40 mg total) by mouth daily.  Dispense: 90 tablet; Refill: 1 - CBC with Differential/Platelet - CMP14+EGFR - Lipid panel  5. Primary insomnia Bedtime routine - traZODone (DESYREL) 50 MG tablet; Take 1-2 tablets (50-100 mg total) by mouth at bedtime as needed for sleep.  Dispense: 60 tablet; Refill: 5  6. Peripheral edema Elevate legs  when sitting Lasix prn Compression socks when on feet a lot - furosemide (LASIX) 20 MG tablet; Take 1 tablet (20 mg total) by mouth daily.  Dispense: 30 tablet; Refill: 3  7. Acquired hypothyroidism Labs pending - Thyroid Panel With TSH Increased levothyroxine to 76mg daily- will recheck labs in 8 weeks  Labs pending Health Maintenance reviewed Diet and exercise encouraged  Follow up plan: 6 months   MS[place] FNP

## 2020-12-13 ENCOUNTER — Other Ambulatory Visit: Payer: Self-pay | Admitting: Nurse Practitioner

## 2020-12-13 DIAGNOSIS — F3342 Major depressive disorder, recurrent, in full remission: Secondary | ICD-10-CM

## 2021-02-27 ENCOUNTER — Other Ambulatory Visit: Payer: Self-pay | Admitting: Nurse Practitioner

## 2021-02-27 DIAGNOSIS — F3342 Major depressive disorder, recurrent, in full remission: Secondary | ICD-10-CM

## 2021-02-27 DIAGNOSIS — I10 Essential (primary) hypertension: Secondary | ICD-10-CM

## 2021-02-27 DIAGNOSIS — E039 Hypothyroidism, unspecified: Secondary | ICD-10-CM

## 2021-03-12 ENCOUNTER — Telehealth: Payer: Self-pay | Admitting: Nurse Practitioner

## 2021-03-12 DIAGNOSIS — I10 Essential (primary) hypertension: Secondary | ICD-10-CM

## 2021-03-12 DIAGNOSIS — E039 Hypothyroidism, unspecified: Secondary | ICD-10-CM

## 2021-03-12 NOTE — Telephone Encounter (Signed)
No answer, no voicemail.  If  patient returns call, please let her know lab orders are placed and she can come in before appointment, needs to be fasting.

## 2021-03-12 NOTE — Telephone Encounter (Signed)
Pt wants to come in on 8/17 to do her labs. Please add labs.

## 2021-03-19 ENCOUNTER — Other Ambulatory Visit: Payer: Self-pay

## 2021-03-19 DIAGNOSIS — I10 Essential (primary) hypertension: Secondary | ICD-10-CM

## 2021-03-19 DIAGNOSIS — E039 Hypothyroidism, unspecified: Secondary | ICD-10-CM

## 2021-03-20 LAB — CBC WITH DIFFERENTIAL/PLATELET
Basophils Absolute: 0.1 10*3/uL (ref 0.0–0.2)
Basos: 2 %
EOS (ABSOLUTE): 0.5 10*3/uL — ABNORMAL HIGH (ref 0.0–0.4)
Eos: 8 %
Hematocrit: 41.3 % (ref 34.0–46.6)
Hemoglobin: 14.3 g/dL (ref 11.1–15.9)
Immature Grans (Abs): 0 10*3/uL (ref 0.0–0.1)
Immature Granulocytes: 0 %
Lymphocytes Absolute: 2.2 10*3/uL (ref 0.7–3.1)
Lymphs: 33 %
MCH: 33.6 pg — ABNORMAL HIGH (ref 26.6–33.0)
MCHC: 34.6 g/dL (ref 31.5–35.7)
MCV: 97 fL (ref 79–97)
Monocytes Absolute: 0.6 10*3/uL (ref 0.1–0.9)
Monocytes: 9 %
Neutrophils Absolute: 3.1 10*3/uL (ref 1.4–7.0)
Neutrophils: 48 %
Platelets: 265 10*3/uL (ref 150–450)
RBC: 4.26 x10E6/uL (ref 3.77–5.28)
RDW: 12.7 % (ref 11.7–15.4)
WBC: 6.5 10*3/uL (ref 3.4–10.8)

## 2021-03-20 LAB — CMP14+EGFR
ALT: 11 IU/L (ref 0–32)
AST: 14 IU/L (ref 0–40)
Albumin/Globulin Ratio: 1.8 (ref 1.2–2.2)
Albumin: 4.3 g/dL (ref 3.8–4.8)
Alkaline Phosphatase: 32 IU/L — ABNORMAL LOW (ref 44–121)
BUN/Creatinine Ratio: 21 (ref 9–23)
BUN: 15 mg/dL (ref 6–24)
Bilirubin Total: 0.3 mg/dL (ref 0.0–1.2)
CO2: 23 mmol/L (ref 20–29)
Calcium: 10 mg/dL (ref 8.7–10.2)
Chloride: 101 mmol/L (ref 96–106)
Creatinine, Ser: 0.71 mg/dL (ref 0.57–1.00)
Globulin, Total: 2.4 g/dL (ref 1.5–4.5)
Glucose: 88 mg/dL (ref 65–99)
Potassium: 4.6 mmol/L (ref 3.5–5.2)
Sodium: 139 mmol/L (ref 134–144)
Total Protein: 6.7 g/dL (ref 6.0–8.5)
eGFR: 105 mL/min/{1.73_m2} (ref >59)

## 2021-03-20 LAB — LIPID PANEL
Chol/HDL Ratio: 3.4 ratio (ref 0.0–4.4)
Cholesterol, Total: 212 mg/dL — ABNORMAL HIGH (ref 100–199)
HDL: 62 mg/dL (ref >39)
LDL Chol Calc (NIH): 120 mg/dL — ABNORMAL HIGH (ref 0–99)
Triglycerides: 174 mg/dL — ABNORMAL HIGH (ref 0–149)
VLDL Cholesterol Cal: 30 mg/dL (ref 5–40)

## 2021-03-20 LAB — THYROID PANEL WITH TSH
Free Thyroxine Index: 1.7 (ref 1.2–4.9)
T3 Uptake Ratio: 27 % (ref 24–39)
T4, Total: 6.2 ug/dL (ref 4.5–12.0)
TSH: 5.35 u[IU]/mL — ABNORMAL HIGH (ref 0.450–4.500)

## 2021-03-20 MED ORDER — LEVOTHYROXINE SODIUM 88 MCG PO TABS: 88.0000 ug | ORAL_TABLET | Freq: Every day | ORAL | 3 refills | Status: DC

## 2021-03-28 ENCOUNTER — Ambulatory Visit: Payer: Self-pay | Admitting: Nurse Practitioner

## 2021-03-31 ENCOUNTER — Encounter: Payer: Self-pay | Admitting: Nurse Practitioner

## 2021-04-15 ENCOUNTER — Other Ambulatory Visit: Payer: Self-pay

## 2021-04-15 ENCOUNTER — Encounter: Payer: Self-pay | Admitting: Nurse Practitioner

## 2021-04-15 ENCOUNTER — Ambulatory Visit (INDEPENDENT_AMBULATORY_CARE_PROVIDER_SITE_OTHER): Payer: Self-pay | Admitting: Nurse Practitioner

## 2021-04-15 VITALS — BP 133/90 | HR 81 | Temp 97.5°F | Resp 20 | Ht 66.0 in | Wt 212.0 lb

## 2021-04-15 DIAGNOSIS — K219 Gastro-esophageal reflux disease without esophagitis: Secondary | ICD-10-CM

## 2021-04-15 DIAGNOSIS — I10 Essential (primary) hypertension: Secondary | ICD-10-CM

## 2021-04-15 DIAGNOSIS — F5101 Primary insomnia: Secondary | ICD-10-CM

## 2021-04-15 DIAGNOSIS — F3342 Major depressive disorder, recurrent, in full remission: Secondary | ICD-10-CM

## 2021-04-15 DIAGNOSIS — R609 Edema, unspecified: Secondary | ICD-10-CM

## 2021-04-15 DIAGNOSIS — J209 Acute bronchitis, unspecified: Secondary | ICD-10-CM

## 2021-04-15 DIAGNOSIS — J440 Chronic obstructive pulmonary disease with acute lower respiratory infection: Secondary | ICD-10-CM

## 2021-04-15 MED ORDER — FLUTICASONE-SALMETEROL 100-50 MCG/ACT IN AEPB: 1.0000 | INHALATION_SPRAY | Freq: Two times a day (BID) | RESPIRATORY_TRACT | 1 refills | Status: DC

## 2021-04-15 MED ORDER — TRAZODONE HCL 100 MG PO TABS: 100.0000 mg | ORAL_TABLET | Freq: Every day | ORAL | 1 refills | Status: DC

## 2021-04-15 MED ORDER — LISINOPRIL 40 MG PO TABS: 40.0000 mg | ORAL_TABLET | Freq: Every day | ORAL | 1 refills | Status: DC

## 2021-04-15 MED ORDER — FUROSEMIDE 20 MG PO TABS: 20.0000 mg | ORAL_TABLET | Freq: Every day | ORAL | 1 refills | Status: DC

## 2021-04-15 MED ORDER — CITALOPRAM HYDROBROMIDE 20 MG PO TABS: 20.0000 mg | ORAL_TABLET | Freq: Every day | ORAL | 1 refills | Status: DC

## 2021-04-15 MED ORDER — ALBUTEROL SULFATE HFA 108 (90 BASE) MCG/ACT IN AERS: 2.0000 | INHALATION_SPRAY | Freq: Four times a day (QID) | RESPIRATORY_TRACT | 2 refills | Status: DC | PRN

## 2021-04-15 MED ORDER — PANTOPRAZOLE SODIUM 40 MG PO TBEC: 40.0000 mg | DELAYED_RELEASE_TABLET | Freq: Every day | ORAL | 1 refills | Status: DC

## 2021-04-15 NOTE — Patient Instructions (Signed)

## 2021-04-15 NOTE — Progress Notes (Signed)
Subjective:    Patient ID: Lynn Golden, female    DOB: [birth year], 50 y.o.   MRN: [id number]   Chief Complaint: medical management of chronic issues    HPI:  1. Primary hypertension No c/o chest pain, sob or headache.  Occasionally checks blood presure at work.  BP Readings from Last 3 Encounters:  04/15/21 133/90  11/21/20 (!) 168/105  09/16/20 (!) 150/102               2. COPD (chronic obstructive pulmonary disease) with acute bronchitis ([facility]) Is on advair and albuterol. Sh eis trying to stop smoking. Is down to 3 cigarettes a day.  3. GERD without esophagitis Is on protonix daily and is doing well.  4. Recurrent major depressive disorder, in full remission ([place]) Has a lot of stress with her health, her husbands health and her dads help. 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 Depression screen [facility] 2/9 04/15/2021 11/21/2020 09/16/2020  Decreased Interest 1 0 2  Down, Depressed, Hopeless 2 0 3  PHQ - 2 Score 3 0 5  Altered sleeping 1 - 3  Tired, decreased energy 2 - 3  Change in appetite 3 - 0  Feeling bad or failure about yourself  1 - 2  Trouble concentrating 1 - 2  Moving slowly or fidgety/restless 1 - 0  Suicidal thoughts 0 - 0  PHQ-9 Score 12 - 15  Difficult doing work/chores Somewhat difficult - Not difficult at all     5. Primary insomnia Is on trazadone and sleeps about 7 hours a night. Does not feel sleepy in mornings.  6. Hypothyroidism Levothyroxine dose was increased a few weeks ago.   7.BMI 33.0-33.9 Has gained weight since she quit smoking Wt Readings from Last 3 Encounters:  04/15/21 212 lb (96.2 kg)  11/21/20 210 lb (95.3 kg)  09/16/20 203 lb (92.1 kg)   BMI Readings from Last 3 Encounters:  04/15/21 34.22 kg/m  11/21/20 33.89 kg/m  09/16/20 32.77 kg/m       Outpatient Encounter Medications as of 04/15/2021  Medication Sig   albuterol (VENTOLIN HFA) 108 (90 Base) MCG/ACT inhaler USE 2 PUFFS EVERY 6 HOURS AS NEEDED (Patient  not taking: Reported on 11/21/2020)   citalopram (CELEXA) 20 MG tablet Take 1 tablet (20 mg total) by mouth daily.   Fluticasone-Salmeterol (ADVAIR DISKUS) 250-50 MCG/DOSE AEPB Inhale 1 puff into the lungs 2 (two) times daily.   furosemide (LASIX) 20 MG tablet Take 1 tablet (20 mg total) by mouth daily.   levothyroxine (SYNTHROID) 88 MCG tablet Take 1 tablet (88 mcg total) by mouth daily.   lisinopril (ZESTRIL) 40 MG tablet Take 1 tablet (40 mg total) by mouth daily.   pantoprazole (PROTONIX) 40 MG tablet Take 1 tablet (40 mg total) by mouth daily.   traZODone (DESYREL) 50 MG tablet Take 1-2 tablets (50-100 mg total) by mouth at bedtime as needed for sleep.   No facility-administered encounter medications on file as of 04/15/2021.    Past Surgical History:  Procedure Laterality Date   BACK SURGERY     HERNIA REPAIR     TUBAL LIGATION      Family History  Problem Relation Age of Onset   Heart disease Mother    Cancer Mother        lung    New complaints: None today  Social history: Lives with husband  Controlled substance contract: n/a     Review of Systems  Constitutional:  Negative for  diaphoresis.  Eyes:  Negative for pain.  Respiratory:  Negative for shortness of breath.   Cardiovascular:  Negative for chest pain, palpitations and leg swelling.  Gastrointestinal:  Negative for abdominal pain.  Endocrine: Negative for polydipsia.  Skin:  Negative for rash.  Neurological:  Negative for dizziness, weakness and headaches.  Hematological:  Does not bruise/bleed easily.  All other systems reviewed and are negative.     Objective:   Physical Exam Vitals and nursing note reviewed.  Constitutional:      General: She is not in acute distress.    Appearance: Normal appearance. She is well-developed.  HENT:     Head: Normocephalic.     Right Ear: Tympanic membrane normal.     Left Ear: Tympanic membrane normal.     Nose: Nose normal.     Mouth/Throat:     Mouth:  Mucous membranes are moist.  Eyes:     Pupils: Pupils are equal, round, and reactive to light.  Neck:     Vascular: No carotid bruit or JVD.  Cardiovascular:     Rate and Rhythm: Normal rate and regular rhythm.     Heart sounds: Normal heart sounds.  Pulmonary:     Effort: Pulmonary effort is normal. No respiratory distress.     Breath sounds: Wheezing (exp wheezes throughout) present. No rales.  Chest:     Chest wall: No tenderness.  Abdominal:     General: Bowel sounds are normal. There is no distension or abdominal bruit.     Palpations: Abdomen is soft. There is no hepatomegaly, splenomegaly, mass or pulsatile mass.     Tenderness: There is no abdominal tenderness.  Musculoskeletal:        General: Normal range of motion.     Cervical back: Normal range of motion and neck supple.  Lymphadenopathy:     Cervical: No cervical adenopathy.  Skin:    General: Skin is warm and dry.  Neurological:     Mental Status: She is alert and oriented to person, place, and time.     Deep Tendon Reflexes: Reflexes are normal and symmetric.  Psychiatric:        Behavior: Behavior normal.        Thought Content: Thought content normal.        Judgment: Judgment normal.     BP 133/90   Pulse 81   Temp (!) 97.5 F (36.4 C) (Temporal)   Resp 20   Ht '5\' 6"'$  (1.676 m)   Wt 212 lb (96.2 kg)   SpO2 98%   BMI 34.22 kg/m       Assessment & Plan:  Lynn Golden comes in today with chief complaint of Medical Management of Chronic Issues   Diagnosis and orders addressed:  1. Primary hypertension Low sodium diet Check blood pressure occasionally - lisinopril (ZESTRIL) 40 MG tablet; Take 1 tablet (40 mg total) by mouth daily.  Dispense: 90 tablet; Refill: 1  2. COPD (chronic obstructive pulmonary disease) with acute bronchitis ([facility]) Continue to stop smoking - albuterol (VENTOLIN HFA) 108 (90 Base) MCG/ACT inhaler; Inhale 2 puffs into the lungs every 6 (six) hours as needed for wheezing  or shortness of breath.  Dispense: 18 g; Refill: 2 - fluticasone-salmeterol (ADVAIR) 100-50 MCG/ACT AEPB; Inhale 1 puff into the lungs 2 (two) times daily.  Dispense: 3 each; Refill: 1  3. GERD without esophagitis Continue protonix as needed  4. Recurrent major depressive disorder, in full remission ([place]) Sress management - citalopram (  CELEXA) 20 MG tablet; Take 1 tablet (20 mg total) by mouth daily.  Dispense: 90 tablet; Refill: 1  5. Primary insomnia Bedtime routine - traZODone (DESYREL) 100 MG tablet; Take 1 tablet (100 mg total) by mouth at bedtime.  Dispense: 90 tablet; Refill: 1  6. Peripheral edema Elevate legs when sitting - furosemide (LASIX) 20 MG tablet; Take 1 tablet (20 mg total) by mouth daily.  Dispense: 90 tablet; Refill: 1   Labs pending Health Maintenance reviewed Diet and exercise encouraged  Follow up plan: 6 months Needs TSH repeated in 8 weeks.   Mary-Margaret Hassell Done, FNP

## 2021-05-23 ENCOUNTER — Other Ambulatory Visit: Payer: Self-pay | Admitting: Nurse Practitioner

## 2021-05-23 DIAGNOSIS — R609 Edema, unspecified: Secondary | ICD-10-CM

## 2021-07-08 ENCOUNTER — Other Ambulatory Visit: Payer: Self-pay | Admitting: Nurse Practitioner

## 2021-07-08 DIAGNOSIS — F3342 Major depressive disorder, recurrent, in full remission: Secondary | ICD-10-CM

## 2021-07-08 DIAGNOSIS — I10 Essential (primary) hypertension: Secondary | ICD-10-CM

## 2021-07-31 ENCOUNTER — Other Ambulatory Visit: Payer: Self-pay | Admitting: Nurse Practitioner

## 2021-07-31 DIAGNOSIS — F5101 Primary insomnia: Secondary | ICD-10-CM

## 2021-09-25 ENCOUNTER — Other Ambulatory Visit: Payer: Self-pay | Admitting: Nurse Practitioner

## 2021-09-25 DIAGNOSIS — J209 Acute bronchitis, unspecified: Secondary | ICD-10-CM

## 2021-09-26 ENCOUNTER — Telehealth: Payer: Self-pay | Admitting: Nurse Practitioner

## 2021-09-26 DIAGNOSIS — J440 Chronic obstructive pulmonary disease with acute lower respiratory infection: Secondary | ICD-10-CM

## 2021-09-26 DIAGNOSIS — J209 Acute bronchitis, unspecified: Secondary | ICD-10-CM

## 2021-09-26 MED ORDER — ADVAIR DISKUS 100-50 MCG/ACT IN AEPB: 1.0000 | INHALATION_SPRAY | Freq: Two times a day (BID) | RESPIRATORY_TRACT | 0 refills | Status: DC

## 2021-09-26 NOTE — Telephone Encounter (Unsigned)
Refilled

## 2021-10-08 ENCOUNTER — Other Ambulatory Visit: Payer: PRIVATE HEALTH INSURANCE

## 2021-10-08 DIAGNOSIS — E782 Mixed hyperlipidemia: Secondary | ICD-10-CM

## 2021-10-08 DIAGNOSIS — I10 Essential (primary) hypertension: Secondary | ICD-10-CM

## 2021-10-08 DIAGNOSIS — E039 Hypothyroidism, unspecified: Secondary | ICD-10-CM

## 2021-10-09 LAB — CMP14+EGFR
ALT: 11 IU/L (ref 0–32)
AST: 12 IU/L (ref 0–40)
Albumin/Globulin Ratio: 1.7 (ref 1.2–2.2)
Albumin: 4.2 g/dL (ref 3.8–4.8)
Alkaline Phosphatase: 76 IU/L (ref 44–121)
BUN/Creatinine Ratio: 23 (ref 9–23)
BUN: 23 mg/dL (ref 6–24)
Bilirubin Total: <0.2 mg/dL (ref 0.0–1.2)
CO2: 27 mmol/L (ref 20–29)
Calcium: 9.6 mg/dL (ref 8.7–10.2)
Chloride: 104 mmol/L (ref 96–106)
Creatinine, Ser: 1.01 mg/dL — ABNORMAL HIGH (ref 0.57–1.00)
Globulin, Total: 2.5 g/dL (ref 1.5–4.5)
Glucose: 95 mg/dL (ref 70–99)
Potassium: 4.6 mmol/L (ref 3.5–5.2)
Sodium: 144 mmol/L (ref 134–144)
Total Protein: 6.7 g/dL (ref 6.0–8.5)
eGFR: 68 mL/min/{1.73_m2} (ref >59)

## 2021-10-09 LAB — CBC WITH DIFFERENTIAL/PLATELET
Basophils Absolute: 0.1 10*3/uL (ref 0.0–0.2)
Basos: 2 %
EOS (ABSOLUTE): 0.5 10*3/uL — ABNORMAL HIGH (ref 0.0–0.4)
Eos: 7 %
Hematocrit: 44.5 % (ref 34.0–46.6)
Hemoglobin: 15 g/dL (ref 11.1–15.9)
Immature Grans (Abs): 0 10*3/uL (ref 0.0–0.1)
Immature Granulocytes: 0 %
Lymphocytes Absolute: 1.7 10*3/uL (ref 0.7–3.1)
Lymphs: 28 %
MCH: 32.1 pg (ref 26.6–33.0)
MCHC: 33.7 g/dL (ref 31.5–35.7)
MCV: 95 fL (ref 79–97)
Monocytes Absolute: 0.4 10*3/uL (ref 0.1–0.9)
Monocytes: 7 %
Neutrophils Absolute: 3.5 10*3/uL (ref 1.4–7.0)
Neutrophils: 56 %
Platelets: 290 10*3/uL (ref 150–450)
RBC: 4.68 x10E6/uL (ref 3.77–5.28)
RDW: 12.1 % (ref 11.7–15.4)
WBC: 6.2 10*3/uL (ref 3.4–10.8)

## 2021-10-09 LAB — LIPID PANEL
Chol/HDL Ratio: 4.2 ratio (ref 0.0–4.4)
Cholesterol, Total: 224 mg/dL — ABNORMAL HIGH (ref 100–199)
HDL: 53 mg/dL (ref >39)
LDL Chol Calc (NIH): 141 mg/dL — ABNORMAL HIGH (ref 0–99)
Triglycerides: 170 mg/dL — ABNORMAL HIGH (ref 0–149)
VLDL Cholesterol Cal: 30 mg/dL (ref 5–40)

## 2021-10-09 LAB — THYROID PANEL WITH TSH
Free Thyroxine Index: 2 (ref 1.2–4.9)
T3 Uptake Ratio: 29 % (ref 24–39)
T4, Total: 7 ug/dL (ref 4.5–12.0)
TSH: 2.16 u[IU]/mL (ref 0.450–4.500)

## 2021-10-09 MED ORDER — ROSUVASTATIN CALCIUM 10 MG PO TABS: 10.0000 mg | ORAL_TABLET | Freq: Every day | ORAL | 1 refills | Status: DC

## 2021-10-13 ENCOUNTER — Encounter: Payer: Self-pay | Admitting: Nurse Practitioner

## 2021-10-13 ENCOUNTER — Ambulatory Visit (INDEPENDENT_AMBULATORY_CARE_PROVIDER_SITE_OTHER): Payer: PRIVATE HEALTH INSURANCE | Admitting: Nurse Practitioner

## 2021-10-13 VITALS — BP 128/84 | HR 78 | Temp 98.3°F | Resp 20 | Ht 66.0 in | Wt 201.0 lb

## 2021-10-13 DIAGNOSIS — I10 Essential (primary) hypertension: Secondary | ICD-10-CM

## 2021-10-13 DIAGNOSIS — E039 Hypothyroidism, unspecified: Secondary | ICD-10-CM

## 2021-10-13 DIAGNOSIS — K219 Gastro-esophageal reflux disease without esophagitis: Secondary | ICD-10-CM | POA: Diagnosis not present

## 2021-10-13 DIAGNOSIS — E782 Mixed hyperlipidemia: Secondary | ICD-10-CM

## 2021-10-13 DIAGNOSIS — F5101 Primary insomnia: Secondary | ICD-10-CM

## 2021-10-13 DIAGNOSIS — F3342 Major depressive disorder, recurrent, in full remission: Secondary | ICD-10-CM

## 2021-10-13 DIAGNOSIS — J440 Chronic obstructive pulmonary disease with acute lower respiratory infection: Secondary | ICD-10-CM

## 2021-10-13 DIAGNOSIS — J209 Acute bronchitis, unspecified: Secondary | ICD-10-CM

## 2021-10-13 DIAGNOSIS — R609 Edema, unspecified: Secondary | ICD-10-CM

## 2021-10-13 MED ORDER — PANTOPRAZOLE SODIUM 40 MG PO TBEC: 40.0000 mg | DELAYED_RELEASE_TABLET | Freq: Every day | ORAL | 1 refills | Status: DC

## 2021-10-13 MED ORDER — FUROSEMIDE 20 MG PO TABS: 20.0000 mg | ORAL_TABLET | Freq: Every day | ORAL | 1 refills | Status: DC

## 2021-10-13 MED ORDER — TRAZODONE HCL 100 MG PO TABS: 100.0000 mg | ORAL_TABLET | Freq: Every day | ORAL | 1 refills | Status: DC

## 2021-10-13 MED ORDER — CITALOPRAM HYDROBROMIDE 20 MG PO TABS: 20.0000 mg | ORAL_TABLET | Freq: Every day | ORAL | 1 refills | Status: DC

## 2021-10-13 MED ORDER — ROSUVASTATIN CALCIUM 10 MG PO TABS: 10.0000 mg | ORAL_TABLET | Freq: Every day | ORAL | 1 refills | Status: DC

## 2021-10-13 MED ORDER — ADVAIR DISKUS 100-50 MCG/ACT IN AEPB: 1.0000 | INHALATION_SPRAY | Freq: Two times a day (BID) | RESPIRATORY_TRACT | 1 refills | Status: DC

## 2021-10-13 MED ORDER — LISINOPRIL 40 MG PO TABS: 40.0000 mg | ORAL_TABLET | Freq: Every day | ORAL | 1 refills | Status: DC

## 2021-10-13 NOTE — Progress Notes (Signed)
? ?Subjective:  ? ? Patient ID: Lynn Golden, female    DOB: 30-Nov-1970, 51 y.o.   MRN: [id number] ? ?Chief Complaint: Medical Management of Chronic Issues ?  ? ?HPI: ? ?Lynn Golden is a 51 y.o. who identifies as a female who was assigned female at birth.  ? ?Social history: ?Lives with: husband ?Work history: nurse- [employer] ? ? ?Comes in today for follow up of the following chronic medical issues: ? ?1. Primary hypertension ?No c/o chest pain, sob or headache. Does not check blood pressure at home. ?BP Readings from Last 3 Encounters:  ?10/13/21 (!) 146/105  ?04/15/21 133/90  ?11/21/20 (!) 168/105  ? ? ? ?2. COPD (chronic obstructive pulmonary disease) with acute bronchitis ([place]) ?Is on advair and is working well to help with her breathing. Has not needed albuterol as much. ? ?3. GERD without esophagitis ?Is on protonix- denies any reflux symptoms. ? ?4. Primary insomnia ?On trazadone and sleep about 7 hours a night ? ?5. Recurrent major depressive disorder, in full remission ([place]) ?Is doing well on  celexa daily. No medication side effects. ?Depression screen [facility] 2/9 10/13/2021 04/15/2021 11/21/2020 09/16/2020 10/10/2019  ?Decreased Interest 1 1 0 2 0  ?Down, Depressed, Hopeless 1 2 0 3 0  ?PHQ - 2 Score 2 3 0 5 0  ?Altered sleeping 1 1 - 3 -  ?Tired, decreased energy 2 2 - 3 -  ?Change in appetite 0 3 - 0 -  ?Feeling bad or failure about yourself  2 1 - 2 -  ?Trouble concentrating 2 1 - 2 -  ?Moving slowly or fidgety/restless 2 1 - 0 -  ?Suicidal thoughts 0 0 - 0 -  ?PHQ-9 Score 11 12 - 15 -  ?Difficult doing work/chores Somewhat difficult Somewhat difficult - Not difficult at all -  ? ?6. hypothyroidism ?Dose adjustment was made several weeks ago . ?Lab Results  ?Component Value Date  ? TSH 2.160 10/08/2021  ? ? ? ?New complaints: ?None today ? ?Allergies  ?Allergen Reactions  ? Tylenol With Codeine #3 [Acetaminophen-Codeine]   ? ?Outpatient Encounter Medications as of 10/13/2021  ?Medication Sig  ? ADVAIR  DISKUS 100-50 MCG/ACT AEPB Inhale 1 puff into the lungs 2 (two) times daily.  ? albuterol (VENTOLIN HFA) 108 (90 Base) MCG/ACT inhaler Inhale 2 puffs into the lungs every 6 (six) hours as needed for wheezing or shortness of breath.  ? citalopram (CELEXA) 20 MG tablet Take 1 tablet (20 mg total) by mouth daily.  ? furosemide (LASIX) 20 MG tablet Take 1 tablet (20 mg total) by mouth daily.  ? levothyroxine (SYNTHROID) 88 MCG tablet Take 1 tablet (88 mcg total) by mouth daily.  ? lisinopril (ZESTRIL) 40 MG tablet Take 1 tablet (40 mg total) by mouth daily.  ? pantoprazole (PROTONIX) 40 MG tablet Take 1 tablet (40 mg total) by mouth daily.  ? rosuvastatin (CRESTOR) 10 MG tablet Take 1 tablet (10 mg total) by mouth daily.  ? traZODone (DESYREL) 100 MG tablet Take 1 tablet (100 mg total) by mouth at bedtime.  ? ?No facility-administered encounter medications on file as of 10/13/2021.  ? ? ?Past Surgical History:  ?Procedure Laterality Date  ? BACK SURGERY    ? HERNIA REPAIR    ? TUBAL LIGATION    ? ? ?Family History  ?Problem Relation Age of Onset  ? Heart disease Mother   ? Cancer Mother   ?     lung  ? ? ? ? ?  Controlled substance contract: n/a ? ? ? ? ?Review of Systems  ?Constitutional:  Negative for diaphoresis.  ?Eyes:  Negative for pain.  ?Respiratory:  Negative for shortness of breath.   ?Cardiovascular:  Negative for chest pain, palpitations and leg swelling.  ?Gastrointestinal:  Negative for abdominal pain.  ?Endocrine: Negative for polydipsia.  ?Skin:  Negative for rash.  ?Neurological:  Negative for dizziness, weakness and headaches.  ?Hematological:  Does not bruise/bleed easily.  ?All other systems reviewed and are negative. ? ?   ?Objective:  ? Physical Exam ?Vitals and nursing note reviewed.  ?Constitutional:   ?   General: She is not in acute distress. ?   Appearance: Normal appearance. She is well-developed.  ?HENT:  ?   Head: Normocephalic.  ?   Right Ear: Tympanic membrane normal.  ?   Left Ear: Tympanic  membrane normal.  ?   Nose: Nose normal.  ?   Mouth/Throat:  ?   Mouth: Mucous membranes are moist.  ?Eyes:  ?   Pupils: Pupils are equal, round, and reactive to light.  ?Neck:  ?   Vascular: No carotid bruit or JVD.  ?Cardiovascular:  ?   Rate and Rhythm: Normal rate and regular rhythm.  ?   Heart sounds: Normal heart sounds.  ?Pulmonary:  ?   Effort: Pulmonary effort is normal. No respiratory distress.  ?   Breath sounds: Normal breath sounds. No wheezing or rales.  ?Chest:  ?   Chest wall: No tenderness.  ?Abdominal:  ?   General: Bowel sounds are normal. There is no distension or abdominal bruit.  ?   Palpations: Abdomen is soft. There is no hepatomegaly, splenomegaly, mass or pulsatile mass.  ?   Tenderness: There is no abdominal tenderness.  ?Musculoskeletal:     ?   General: Normal range of motion.  ?   Cervical back: Normal range of motion and neck supple.  ?Lymphadenopathy:  ?   Cervical: No cervical adenopathy.  ?Skin: ?   General: Skin is warm and dry.  ?Neurological:  ?   Mental Status: She is alert and oriented to person, place, and time.  ?   Deep Tendon Reflexes: Reflexes are normal and symmetric.  ?Psychiatric:     ?   Behavior: Behavior normal.     ?   Thought Content: Thought content normal.     ?   Judgment: Judgment normal.  ? ?BP 128/84   Pulse 78   Temp 98.3 ?F (36.8 ?C) (Temporal)   Resp 20   Ht '5\' 6"'$  (1.676 m)   Wt 201 lb (91.2 kg)   SpO2 97%   BMI 32.44 kg/m?  ? ? ? ? ? ?   ?Assessment & Plan:  ? ?Lynn Golden comes in today with chief complaint of Medical Management of Chronic Issues ? ? ?Diagnosis and orders addressed: ? ?1. Primary hypertension ?Low sodium diet ?- lisinopril (ZESTRIL) 40 MG tablet; Take 1 tablet (40 mg total) by mouth daily.  Dispense: 90 tablet; Refill: 1 ? ?2. COPD (chronic obstructive pulmonary disease) with acute bronchitis ([place]) ?[place] cessation encouraged ?- ADVAIR DISKUS 100-50 MCG/ACT AEPB; Inhale 1 puff into the lungs 2 (two) times daily.  Dispense:  180 each; Refill: 1 ? ?3. GERD without esophagitis ?Avoid spicy foods ?Do not eat 2 hours prior to bedtime ?- pantoprazole (PROTONIX) 40 MG tablet; Take 1 tablet (40 mg total) by mouth daily.  Dispense: 90 tablet; Refill: 1 ? ?4. Primary insomnia ?Bedtime routine ?-  traZODone (DESYREL) 100 MG tablet; Take 1 tablet (100 mg total) by mouth at bedtime.  Dispense: 90 tablet; Refill: 1 ? ?5. Recurrent major depressive disorder, in full remission ([place]) ?Stress management ?- citalopram (CELEXA) 20 MG tablet; Take 1 tablet (20 mg total) by mouth daily.  Dispense: 90 tablet; Refill: 1 ? ?6. Acquired hypothyroidism ?Labs reviewed ? ?7. Mixed hyperlipidemia ?Low fat diet ?- rosuvastatin (CRESTOR) 10 MG tablet; Take 1 tablet (10 mg total) by mouth daily.  Dispense: 90 tablet; Refill: 1 ? ?8. Peripheral edema ?Elevate legs when sitting ?- furosemide (LASIX) 20 MG tablet; Take 1 tablet (20 mg total) by mouth daily.  Dispense: 90 tablet; Refill: 1 ? ? ?Labs pending ?Health Maintenance reviewed ?Diet and exercise encouraged ? ?Follow up plan: ?6 months ? ? ?Mary-Margaret Hassell Done, FNP ? ?

## 2021-10-13 NOTE — Patient Instructions (Signed)
Stress, Adult °Stress is a normal reaction to life events. Stress is what you feel when life demands more than you are used to, or more than you think you can handle. °Some stress can be useful, such as studying for a test or meeting a deadline at work. Stress that occurs too often or for too long can cause problems. Long-lasting stress is called chronic stress. Chronic stress can affect your emotional health and interfere with relationships and normal daily activities. °Too much stress can weaken your body's defense system (immune system) and increase your risk for physical illness. If you already have a medical problem, stress can make it worse. °What are the causes? °All sorts of life events can cause stress. An event that causes stress for one person may not be stressful for someone else. Major life events, whether positive or negative, commonly cause stress. Examples include: °Losing a job or starting a new job. °Losing a loved one. °Moving to a new town or home. °Getting married or divorced. °Having a baby. °Getting injured or sick. °Less obvious life events can also cause stress, especially if they occur day after day or in combination with each other. Examples include: °Working long hours. °Driving in traffic. °Caring for children. °Being in debt. °Being in a difficult relationship. °What are the signs or symptoms? °Stress can cause emotional and physical symptoms and can lead to unhealthy behaviors. These include the following: °Emotional symptoms °Anxiety. This is feeling worried, afraid, on edge, overwhelmed, or out of control. °Anger, including irritation or impatience. °Depression. This is feeling sad, down, helpless, or guilty. °Trouble focusing, remembering, or making decisions. °Physical symptoms °Aches and pains. These may affect your head, neck, back, stomach, or other areas of your body. °Tight muscles or a clenched jaw. °Low energy. °Trouble sleeping. °Unhealthy behaviors °Eating to feel better  (overeating) or skipping meals. °Working too much or putting off tasks. °Smoking, drinking alcohol, or using drugs to feel better. °How is this diagnosed? °A stress disorder is diagnosed through an assessment by your health care provider. A stress disorder may be diagnosed based on: °Your symptoms and any stressful life events. °Your medical history. °Tests to rule out other causes of your symptoms. °Depending on your condition, your health care provider may refer you to a specialist for further evaluation. °How is this treated? °Stress management techniques are the recommended treatment for stress. Medicine is not typically recommended for treating stress. °Techniques to reduce your reaction to stressful life events include: °Identifying stress. Monitor yourself for symptoms of stress and notice what causes stress for you. These skills may help you to avoid or prepare for stressful events. °Managing time. Set your priorities, keep a calendar of events, and learn to say no. These actions can help you avoid taking on too much. °Techniques for dealing with stress include: °Rethinking the problem. Try to think realistically about stressful events rather than ignoring them or overreacting. Try to find the positives in a stressful situation rather than focusing on the negatives. °Exercise. Physical exercise can release both physical and emotional tension. The key is to find a form of exercise that you enjoy and do it regularly. °Relaxation techniques. These relax the body and mind. Find one or more that you enjoy and use the techniques regularly. Examples include: °Meditation, deep breathing, or progressive relaxation techniques. °Yoga or tai chi. °Biofeedback, mindfulness techniques, or journaling. °Listening to music, being in nature, or taking part in other hobbies. °Practicing a healthy lifestyle. Eat a balanced diet,   drink plenty of water, limit or avoid caffeine, and get plenty of sleep. °Having a strong support  network. Spend time with family, friends, or other people you enjoy being around. Express your feelings and talk things over with someone you trust. °Counseling or talk therapy with a mental health provider may help if you are having trouble managing stress by yourself. °Follow these instructions at home: °Lifestyle ° °Avoid drugs. °Do not use any products that contain nicotine or tobacco. These products include cigarettes, chewing tobacco, and vaping devices, such as e-cigarettes. If you need help quitting, ask your health care provider. °If you drink alcohol: °Limit how much you have to: °0-1 drink a day for women who are not pregnant. °0-2 drinks a day for men. °Know how much alcohol is in a drink. In the [place] one drink equals one 12 oz bottle of beer (355 mL), one 5 oz glass of wine (148 mL), or one 1½ oz glass of hard liquor (44 mL). °Do not use alcohol or drugs to relax. °Eat a balanced diet that includes fresh fruits and vegetables, whole grains, lean meats, fish, eggs, beans, and low-fat dairy. Avoid processed foods and foods high in added fat, sugar, and salt. °Exercise at least 30 minutes on 5 or more days each week. °Get 7-8 hours of sleep each night. °General instructions ° °Practice stress management techniques as told by your health care provider. °Drink enough fluid to keep your urine pale yellow. °Take over-the-counter and prescription medicines only as told by your health care provider. °Keep all follow-up visits. This is important. °Contact a health care provider if: °Your symptoms get worse. °You have new symptoms. °You feel overwhelmed by your problems and can no longer manage them by yourself. °Get help right away if: °You have thoughts of hurting yourself or others. °Get help right awayif you feel like you may hurt yourself or others, or have thoughts about taking your own life. Go to your nearest emergency room or: °Call 911. °Call the National Suicide Prevention Lifeline at 1-800-273-8255 or  988. This is open 24 hours a day. °Text the Crisis Text Line at 741741. °Summary °Stress is a normal reaction to life events. It can cause problems if it happens too often or for too long. °Practicing stress management techniques is the best way to treat stress. °Counseling or talk therapy with a mental health provider may help if you are having trouble managing stress by yourself. °This information is not intended to replace advice given to you by your health care provider. Make sure you discuss any questions you have with your health care provider. °Document Revised: 02/27/2021 Document Reviewed: 02/27/2021 °Elsevier Patient Education © 2022 Elsevier Inc. ° °

## 2021-10-22 ENCOUNTER — Ambulatory Visit: Payer: PRIVATE HEALTH INSURANCE | Admitting: Nurse Practitioner

## 2022-01-12 ENCOUNTER — Other Ambulatory Visit: Payer: Self-pay | Admitting: Nurse Practitioner

## 2022-01-12 DIAGNOSIS — J209 Acute bronchitis, unspecified: Secondary | ICD-10-CM

## 2022-01-21 ENCOUNTER — Other Ambulatory Visit: Payer: Self-pay | Admitting: Nurse Practitioner

## 2022-01-21 DIAGNOSIS — F3342 Major depressive disorder, recurrent, in full remission: Secondary | ICD-10-CM

## 2022-01-21 DIAGNOSIS — I10 Essential (primary) hypertension: Secondary | ICD-10-CM

## 2022-02-24 ENCOUNTER — Encounter: Payer: Self-pay | Admitting: Nurse Practitioner

## 2022-02-24 ENCOUNTER — Ambulatory Visit (INDEPENDENT_AMBULATORY_CARE_PROVIDER_SITE_OTHER): Payer: Managed Care, Other (non HMO) | Admitting: Nurse Practitioner

## 2022-02-24 VITALS — BP 123/89 | HR 62 | Temp 98.0°F | Resp 20 | Ht 66.0 in | Wt 200.0 lb

## 2022-02-24 DIAGNOSIS — M2550 Pain in unspecified joint: Secondary | ICD-10-CM

## 2022-02-24 NOTE — Progress Notes (Signed)
   Subjective:    Patient ID: Lynn Golden, female    DOB: [birth year], 51 y.o.   MRN: [id number]   Chief Complaint: Wants to be checked for RA   HPI Patient wants to be tested for RA. She has been having all over joint pain. Her hips hurt all the time . Her fingers hur when she is working or typing. RA runs in her family. She does not take anything other then aleve or advil for pain which does help. Rates pain 6-7/10 but after meds goes down to 3-4/10.     Review of Systems  Constitutional:  Negative for diaphoresis.  Eyes:  Negative for pain.  Respiratory:  Negative for shortness of breath.   Cardiovascular:  Negative for chest pain, palpitations and leg swelling.  Gastrointestinal:  Negative for abdominal pain.  Endocrine: Negative for polydipsia.  Skin:  Negative for rash.  Neurological:  Negative for dizziness, weakness and headaches.  Hematological:  Does not bruise/bleed easily.  All other systems reviewed and are negative.      Objective:   Physical Exam Vitals reviewed.  Constitutional:      Appearance: Normal appearance.  Cardiovascular:     Rate and Rhythm: Normal rate and regular rhythm.     Heart sounds: Normal heart sounds.  Pulmonary:     Effort: Pulmonary effort is normal.     Breath sounds: Wheezing (insp wheezes throughout) present.  Musculoskeletal:     Comments: FROM  of all joints with no pain No edema or effusions noted  Skin:    General: Skin is warm.  Neurological:     General: No focal deficit present.     Mental Status: She is alert and oriented to person, place, and time.  Psychiatric:        Mood and Affect: Mood normal.        Behavior: Behavior normal.   BP 123/89   Pulse 62   Temp 98 F (36.7 C) (Temporal)   Resp 20   Ht $R'5\' 6"'MU$  (1.676 m)   Wt 200 lb (90.7 kg)   SpO2 98%   BMI 32.28 kg/m          Assessment & Plan:   Early Ord in today with chief complaint of Wants to be checked for RA   1. Arthralgia,  unspecified joint Moist heat Rest Will talk when labs are back - Arthritis Panel - CBC with Differential/Platelet - CMP14+EGFR    The above assessment and management plan was discussed with the patient. The patient verbalized understanding of and has agreed to the management plan. Patient is aware to call the clinic if symptoms persist or worsen. Patient is aware when to return to the clinic for a follow-up visit. Patient educated on when it is appropriate to go to the emergency department.   Mary-Margaret Hassell Done, FNP

## 2022-02-24 NOTE — Patient Instructions (Signed)
Joint Pain  Joint pain can be caused by many things. It is likely to go away if you follow instructions from your doctor for taking care of yourself at home. Sometimes, you may need more treatment. Follow these instructions at home: Managing pain, stiffness, and swelling     If told, put ice on the painful area. To do this: If you have a removable elastic bandage, sling, or splint, take it off as told by your doctor. Put ice in a plastic bag. Place a towel between your skin and the bag. Leave the ice on for 20 minutes, 2-3 times a day. Take off the ice if your skin turns bright red. This is very important. If you cannot feel pain, heat, or cold, you have a greater risk of damage to the area. Move your fingers or toes below the painful joint often. Raise the painful joint above the level of your heart while you are sitting or lying down. If told, put heat on the painful area. Do this as often as told by your doctor. Use the heat source that your doctor recommends, such as a moist heat pack or a heating pad. Place a towel between your skin and the heat source. Leave the heat on for 20-30 minutes. Take off the heat if your skin gets bright red. This is especially important if you are unable to feel pain, heat, or cold. You may have a greater risk of getting burned. Activity Rest the painful joint for as long as told by your doctor. Do not do things that cause pain or make your pain worse. Begin exercising or stretching the affected area, as told by your doctor. Ask your doctor what types of exercise are safe for you. Return to your normal activities when your doctor says that it is safe. If you have an elastic bandage, sling, or splint: Wear it as told by your doctor. Take it only as told by your doctor. Loosen it your fingers or toes below the joint: Tingle. Become numb. Get cold and blue. Keep it clean. Ask your doctor if you should take it off before bathing. If it is not  waterproof: Do not let it get wet. Cover it with a watertight covering when you take a bath or shower. General instructions Take over-the-counter and prescription medicines only as told by your doctor. This may include medicines taken by mouth or applied to the skin. Do not smoke or use any products that contain nicotine or tobacco. If you need help quitting, ask your doctor. Keep all follow-up visits as told by your doctor. This is important. Contact a doctor if: You have pain that gets worse and does not get better with medicine. Your joint pain does not get better in 3 days. You have more bruising or swelling. You have a fever. You lose 10 lb (4.5 kg) or more without trying. Get help right away if: You cannot move the joint. Your fingers or toes tingle, become numb. or get cold and blue. You have a fever along with a joint that is red, warm, and swollen. Summary Joint pain can be caused by many things. It often goes away if you follow instructions from your doctor for taking care of yourself at home. Rest the painful joint for as long as told. Do not do things that cause pain or make your pain worse. Take over-the-counter and prescription medicines only as told by your doctor. This information is not intended to replace advice given to you   by your health care provider. Make sure you discuss any questions you have with your health care provider. Document Revised: 11/01/2019 Document Reviewed: 11/01/2019 Elsevier Patient Education  2023 Elsevier Inc.  

## 2022-02-25 LAB — CMP14+EGFR
ALT: 10 IU/L (ref 0–32)
AST: 11 IU/L (ref 0–40)
Albumin/Globulin Ratio: 2 (ref 1.2–2.2)
Albumin: 4.3 g/dL (ref 3.9–4.9)
Alkaline Phosphatase: 79 IU/L (ref 44–121)
BUN/Creatinine Ratio: 13 (ref 9–23)
BUN: 13 mg/dL (ref 6–24)
Bilirubin Total: <0.2 mg/dL (ref 0.0–1.2)
CO2: 23 mmol/L (ref 20–29)
Calcium: 9.2 mg/dL (ref 8.7–10.2)
Chloride: 105 mmol/L (ref 96–106)
Creatinine, Ser: 1.01 mg/dL — ABNORMAL HIGH (ref 0.57–1.00)
Globulin, Total: 2.2 g/dL (ref 1.5–4.5)
Glucose: 85 mg/dL (ref 70–99)
Potassium: 4.7 mmol/L (ref 3.5–5.2)
Sodium: 141 mmol/L (ref 134–144)
Total Protein: 6.5 g/dL (ref 6.0–8.5)
eGFR: 68 mL/min/{1.73_m2} (ref >59)

## 2022-02-25 LAB — ARTHRITIS PANEL
Basophils Absolute: 0.1 10*3/uL (ref 0.0–0.2)
Basos: 2 %
EOS (ABSOLUTE): 0.4 10*3/uL (ref 0.0–0.4)
Eos: 7 %
Hematocrit: 41.7 % (ref 34.0–46.6)
Hemoglobin: 13.8 g/dL (ref 11.1–15.9)
Immature Grans (Abs): 0 10*3/uL (ref 0.0–0.1)
Immature Granulocytes: 0 %
Lymphocytes Absolute: 1.6 10*3/uL (ref 0.7–3.1)
Lymphs: 28 %
MCH: 31.9 pg (ref 26.6–33.0)
MCHC: 33.1 g/dL (ref 31.5–35.7)
MCV: 96 fL (ref 79–97)
Monocytes Absolute: 0.4 10*3/uL (ref 0.1–0.9)
Monocytes: 7 %
Neutrophils Absolute: 3.4 10*3/uL (ref 1.4–7.0)
Neutrophils: 56 %
Platelets: 273 10*3/uL (ref 150–450)
RBC: 4.33 x10E6/uL (ref 3.77–5.28)
RDW: 12.9 % (ref 11.7–15.4)
Rheumatoid fact SerPl-aCnc: 10.2 IU/mL (ref <14.0)
Sed Rate: 12 mm/hr (ref 0–32)
Uric Acid: 5.2 mg/dL (ref 2.6–6.2)
WBC: 5.9 10*3/uL (ref 3.4–10.8)

## 2022-04-16 ENCOUNTER — Ambulatory Visit: Payer: PRIVATE HEALTH INSURANCE | Admitting: Nurse Practitioner

## 2022-04-22 ENCOUNTER — Other Ambulatory Visit: Payer: Self-pay | Admitting: Nurse Practitioner

## 2022-04-22 DIAGNOSIS — F3342 Major depressive disorder, recurrent, in full remission: Secondary | ICD-10-CM

## 2022-04-22 DIAGNOSIS — I10 Essential (primary) hypertension: Secondary | ICD-10-CM

## 2022-06-16 ENCOUNTER — Other Ambulatory Visit: Payer: Self-pay | Admitting: Nurse Practitioner

## 2022-06-16 DIAGNOSIS — F5101 Primary insomnia: Secondary | ICD-10-CM

## 2022-07-28 ENCOUNTER — Encounter: Payer: Self-pay | Admitting: Nurse Practitioner

## 2022-07-28 ENCOUNTER — Encounter: Payer: Managed Care, Other (non HMO) | Admitting: Nurse Practitioner

## 2022-07-29 ENCOUNTER — Telehealth: Payer: Self-pay | Admitting: Nurse Practitioner

## 2022-07-29 ENCOUNTER — Other Ambulatory Visit: Payer: Self-pay

## 2022-07-29 DIAGNOSIS — F3342 Major depressive disorder, recurrent, in full remission: Secondary | ICD-10-CM

## 2022-07-29 MED ORDER — LEVOTHYROXINE SODIUM 88 MCG PO TABS: ORAL_TABLET | ORAL | 0 refills | Status: DC

## 2022-07-29 MED ORDER — CITALOPRAM HYDROBROMIDE 20 MG PO TABS: ORAL_TABLET | ORAL | 0 refills | Status: DC
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
## 2022-07-29 NOTE — Telephone Encounter (Signed)
Refills sent to pharmacy. 

## 2022-08-04 ENCOUNTER — Encounter: Payer: Managed Care, Other (non HMO) | Admitting: Nurse Practitioner

## 2022-08-17 ENCOUNTER — Encounter: Payer: Self-pay | Admitting: Nurse Practitioner

## 2022-08-18 ENCOUNTER — Ambulatory Visit (INDEPENDENT_AMBULATORY_CARE_PROVIDER_SITE_OTHER): Payer: Managed Care, Other (non HMO)

## 2022-08-18 ENCOUNTER — Ambulatory Visit: Payer: Managed Care, Other (non HMO) | Admitting: Nurse Practitioner

## 2022-08-18 ENCOUNTER — Encounter: Payer: Self-pay | Admitting: Nurse Practitioner

## 2022-08-18 VITALS — BP 128/87 | HR 85 | Temp 98.0°F | Resp 20 | Ht 66.0 in | Wt 193.0 lb

## 2022-08-18 DIAGNOSIS — J440 Chronic obstructive pulmonary disease with acute lower respiratory infection: Secondary | ICD-10-CM | POA: Diagnosis not present

## 2022-08-18 DIAGNOSIS — Z0001 Encounter for general adult medical examination with abnormal findings: Secondary | ICD-10-CM | POA: Diagnosis not present

## 2022-08-18 DIAGNOSIS — I10 Essential (primary) hypertension: Secondary | ICD-10-CM | POA: Diagnosis not present

## 2022-08-18 DIAGNOSIS — F5101 Primary insomnia: Secondary | ICD-10-CM

## 2022-08-18 DIAGNOSIS — K219 Gastro-esophageal reflux disease without esophagitis: Secondary | ICD-10-CM

## 2022-08-18 DIAGNOSIS — Z1211 Encounter for screening for malignant neoplasm of colon: Secondary | ICD-10-CM

## 2022-08-18 DIAGNOSIS — E782 Mixed hyperlipidemia: Secondary | ICD-10-CM

## 2022-08-18 DIAGNOSIS — Z0000 Encounter for general adult medical examination without abnormal findings: Secondary | ICD-10-CM

## 2022-08-18 DIAGNOSIS — Z780 Asymptomatic menopausal state: Secondary | ICD-10-CM | POA: Diagnosis not present

## 2022-08-18 DIAGNOSIS — J209 Acute bronchitis, unspecified: Secondary | ICD-10-CM

## 2022-08-18 DIAGNOSIS — F3342 Major depressive disorder, recurrent, in full remission: Secondary | ICD-10-CM

## 2022-08-18 DIAGNOSIS — Z13820 Encounter for screening for osteoporosis: Secondary | ICD-10-CM

## 2022-08-18 DIAGNOSIS — R52 Pain, unspecified: Secondary | ICD-10-CM

## 2022-08-18 DIAGNOSIS — E039 Hypothyroidism, unspecified: Secondary | ICD-10-CM

## 2022-08-18 DIAGNOSIS — R609 Edema, unspecified: Secondary | ICD-10-CM

## 2022-08-18 DIAGNOSIS — Z6832 Body mass index (BMI) 32.0-32.9, adult: Secondary | ICD-10-CM

## 2022-08-18 LAB — CBC WITH DIFFERENTIAL/PLATELET

## 2022-08-18 MED ORDER — FUROSEMIDE 20 MG PO TABS: 20.0000 mg | ORAL_TABLET | Freq: Every day | ORAL | 1 refills | Status: DC

## 2022-08-18 MED ORDER — ADVAIR DISKUS 100-50 MCG/ACT IN AEPB: 1.0000 | INHALATION_SPRAY | Freq: Two times a day (BID) | RESPIRATORY_TRACT | 1 refills | Status: DC

## 2022-08-18 MED ORDER — GABAPENTIN 100 MG PO CAPS: 100.0000 mg | ORAL_CAPSULE | Freq: Two times a day (BID) | ORAL | 1 refills | Status: DC

## 2022-08-18 MED ORDER — LISINOPRIL 40 MG PO TABS: ORAL_TABLET | ORAL | 1 refills | Status: DC

## 2022-08-18 MED ORDER — TRAZODONE HCL 100 MG PO TABS: 100.0000 mg | ORAL_TABLET | Freq: Every day | ORAL | 0 refills | Status: DC

## 2022-08-18 MED ORDER — ROSUVASTATIN CALCIUM 10 MG PO TABS: 10.0000 mg | ORAL_TABLET | Freq: Every day | ORAL | 1 refills | Status: DC

## 2022-08-18 MED ORDER — CITALOPRAM HYDROBROMIDE 20 MG PO TABS: ORAL_TABLET | ORAL | 0 refills | Status: DC

## 2022-08-18 MED ORDER — LEVOTHYROXINE SODIUM 88 MCG PO TABS: ORAL_TABLET | ORAL | 0 refills | Status: DC

## 2022-08-18 MED ORDER — PANTOPRAZOLE SODIUM 40 MG PO TBEC: 40.0000 mg | DELAYED_RELEASE_TABLET | Freq: Every day | ORAL | 1 refills | Status: DC

## 2022-08-18 NOTE — Progress Notes (Signed)
+  Subjective:    Patient ID: Lynn Golden, female    DOB: 1971-01-19, 52 y.o.   MRN: [id number]   Chief Complaint: annual physical exam   HPI:  Lynn Golden is a 52 y.o. who identifies as a female who was assigned female at birth.   Social history: Lives with: husband Work history: works for [employer] in today for follow up of the following chronic medical issues:  1. Annual physical exam No pap today  2. Primary hypertension No c/o chest pain, sob or headache. Checks blood pressure occasionally at work BP Readings from Last 3 Encounters:  02/24/22 123/89  10/13/21 128/84  04/15/21 133/90     3. COPD (chronic obstructive pulmonary disease) with acute bronchitis (HCC) Smoker. Suppoose to use adviar daily. Has not needed albuterol laately  4. GERD without esophagitis Is on protonix which works well for her  5. Acquired hypothyroidism No issues that she is aware of. Lab Results  Component Value Date   TSH 2.160 10/08/2021     6. Recurrent major depressive disorder, in full remission ([place]) Has been o celexa for awhile now. Says she is doing well.    08/18/2022    1:55 PM 02/24/2022    2:28 PM 10/13/2021   12:55 PM  Depression screen PHQ 2/9  Decreased Interest '1 2 1  '$ Down, Depressed, Hopeless '1 1 1  '$ PHQ - 2 Score '2 3 2  '$ Altered sleeping 3 0 1  Tired, decreased energy '1 2 2  '$ Change in appetite 0 0 0  Feeling bad or failure about yourself  '1 1 2  '$ Trouble concentrating '2 1 2  '$ Moving slowly or fidgety/restless '1 1 2  '$ Suicidal thoughts 0 0 0  PHQ-9 Score '10 8 11  '$ Difficult doing work/chores Somewhat difficult Somewhat difficult Somewhat difficult      7. Primary insomnia Takes trazadone to sleep. Sleeps about 6 hours a night. Trazadone not working as well as it was.  8. hyperlipidemia Tries to watch diet but does no dedicated exercise Lab Results  Component Value Date   CHOL 224 (H) 10/08/2021   HDL 53 10/08/2021   LDLCALC 141 (H)  10/08/2021   TRIG 170 (H) 10/08/2021   CHOLHDL 4.2 10/08/2021   9. Peripheral edema Is on  lasix. Works well.  10. BMI 32.0-32.9,adult No recent weight changes Wt Readings from Last 3 Encounters:  08/18/22 193 lb (87.5 kg)  02/24/22 200 lb (90.7 kg)  10/13/21 201 lb (91.2 kg)   BMI Readings from Last 3 Encounters:  08/18/22 31.15 kg/m  02/24/22 32.28 kg/m  10/13/21 32.44 kg/m     New complaints: Has all over body aches. Would like to try gabapentin.  Allergies  Allergen Reactions   Tylenol With Codeine #3 [Acetaminophen-Codeine]    Outpatient Encounter Medications as of 08/18/2022  Medication Sig   ADVAIR DISKUS 100-50 MCG/ACT AEPB Inhale 1 puff into the lungs 2 (two) times daily.   albuterol (VENTOLIN HFA) 108 (90 Base) MCG/ACT inhaler USE 2 PUFFS EVERY 6 HOURS AS NEEDED   citalopram (CELEXA) 20 MG tablet TAKE ONE (1) TABLET BY MOUTH EVERY DAY   furosemide (LASIX) 20 MG tablet Take 1 tablet (20 mg total) by mouth daily.   levothyroxine (SYNTHROID) 88 MCG tablet TAKE ONE (1) TABLET BY MOUTH EVERY DAY   lisinopril (ZESTRIL) 40 MG tablet TAKE ONE (1) TABLET BY MOUTH EVERY DAY   pantoprazole (PROTONIX) 40 MG tablet Take 1 tablet (40 mg total) by  mouth daily.   rosuvastatin (CRESTOR) 10 MG tablet Take 1 tablet (10 mg total) by mouth daily. (Patient not taking: Reported on 02/24/2022)   traZODone (DESYREL) 100 MG tablet TAKE ONE TABLET BY MOUTH AT BEDTIME   No facility-administered encounter medications on file as of 08/18/2022.    Past Surgical History:  Procedure Laterality Date   BACK SURGERY     HERNIA REPAIR     TUBAL LIGATION      Family History  Problem Relation Age of Onset   Heart disease Mother    Cancer Mother        lung      Controlled substance contract: n/a     Review of Systems  Constitutional:  Negative for diaphoresis.  Eyes:  Negative for pain.  Respiratory:  Negative for shortness of breath.   Cardiovascular:  Negative for chest pain,  palpitations and leg swelling.  Gastrointestinal:  Negative for abdominal pain.  Endocrine: Negative for polydipsia.  Skin:  Negative for rash.  Neurological:  Negative for dizziness, weakness and headaches.  Hematological:  Does not bruise/bleed easily.  All other systems reviewed and are negative.      Objective:   Physical Exam Vitals and nursing note reviewed.  Constitutional:      General: She is not in acute distress.    Appearance: Normal appearance. She is well-developed.  HENT:     Head: Normocephalic.     Right Ear: Tympanic membrane normal.     Left Ear: Tympanic membrane normal.     Nose: Nose normal.     Mouth/Throat:     Mouth: Mucous membranes are moist.  Eyes:     Pupils: Pupils are equal, round, and reactive to light.  Neck:     Vascular: No carotid bruit or JVD.  Cardiovascular:     Rate and Rhythm: Normal rate and regular rhythm.     Heart sounds: Normal heart sounds.  Pulmonary:     Effort: Pulmonary effort is normal. No respiratory distress.     Breath sounds: Normal breath sounds. No wheezing or rales.  Chest:     Chest wall: No tenderness.  Abdominal:     General: Bowel sounds are normal. There is no distension or abdominal bruit.     Palpations: Abdomen is soft. There is no hepatomegaly, splenomegaly, mass or pulsatile mass.     Tenderness: There is no abdominal tenderness.  Musculoskeletal:        General: Normal range of motion.     Cervical back: Normal range of motion and neck supple.  Lymphadenopathy:     Cervical: No cervical adenopathy.  Skin:    General: Skin is warm and dry.  Neurological:     Mental Status: She is alert and oriented to person, place, and time.     Deep Tendon Reflexes: Reflexes are normal and symmetric.  Psychiatric:        Behavior: Behavior normal.        Thought Content: Thought content normal.        Judgment: Judgment normal.    BP 128/87   Pulse 85   Temp 98 F (36.7 C) (Temporal)   Resp 20   Ht '5\' 6"'$   (1.676 m)   Wt 193 lb (87.5 kg)   SpO2 99%   BMI 31.15 kg/m   EKG- NSR-Lynn Hassell Done, FNP  chest xray- pending radiologist-Preliminary reading by Lynn Collum, FNP  [facility]       Assessment & Plan:  Lynn Golden comes in today with chief complaint of Annual Exam   Diagnosis and orders addressed:  1. Annual physical exam - CBC with Differential/Platelet - VITAMIN D 25 Hydroxy (Vit-D Deficiency, Fractures)  2. Primary hypertension Low sodium diet - CMP14+EGFR - Lipid panel - lisinopril (ZESTRIL) 40 MG tablet; TAKE ONE (1) TABLET BY MOUTH EVERY DAY  Dispense: 90 tablet; Refill: 1 - DG Chest 2 View - EKG 12-Lead  3. COPD (chronic obstructive pulmonary disease) with acute bronchitis ([place]) Continue adviar Smoking cessation encouraged - ADVAIR DISKUS 100-50 MCG/ACT AEPB; Inhale 1 puff into the lungs 2 (two) times daily.  Dispense: 180 each; Refill: 1  4. GERD without esophagitis Avoid spicy foods Do not eat 2 hours prior to bedtime  - pantoprazole (PROTONIX) 40 MG tablet; Take 1 tablet (40 mg total) by mouth daily.  Dispense: 90 tablet; Refill: 1  5. Acquired hypothyroidism Labs pending - Thyroid Panel With TSH - levothyroxine (SYNTHROID) 88 MCG tablet; TAKE ONE (1) TABLET BY MOUTH EVERY DAY  Dispense: 90 tablet; Refill: 0  6. Recurrent major depressive disorder, in full remission ([place]) Stress management - citalopram (CELEXA) 20 MG tablet; TAKE ONE (1) TABLET BY MOUTH EVERY DAY  Dispense: 90 tablet; Refill: 0  7. Primary insomnia Bedtime routine - traZODone (DESYREL) 100 MG tablet; Take 1 tablet (100 mg total) by mouth at bedtime.  Dispense: 90 tablet; Refill: 0  8. BMI 32.0-32.9,adult Discussed diet and exercise for person with BMI >25 Will recheck weight in 3-6 months   9. Body aches Wants t try neurontin - gabapentin (NEURONTIN) 100 MG capsule; Take 1 capsule (100 mg total) by mouth 2 (two) times daily.  Dispense: 180 capsule; Refill: 1  10. Mixed  hyperlipidemia Low fat diet - rosuvastatin (CRESTOR) 10 MG tablet; Take 1 tablet (10 mg total) by mouth daily.  Dispense: 90 tablet; Refill: 1  11. Peripheral edema Elevate legs hen sitting Compression socks - furosemide (LASIX) 20 MG tablet; Take 1 tablet (20 mg total) by mouth daily.  Dispense: 90 tablet; Refill: 1  12. Screening for osteoporosis Weight bearing exrcisesenciuraged - DG [facility] DEXA  Cologuard ordered Labs pending Health Maintenance reviewed Diet and exercise encouraged  Follow up plan: 6 months   [place], FNP

## 2022-08-19 LAB — CMP14+EGFR
ALT: 14 IU/L (ref 0–32)
AST: 13 IU/L (ref 0–40)
Albumin/Globulin Ratio: 1.8 (ref 1.2–2.2)
Albumin: 4.2 g/dL (ref 3.9–4.9)
Alkaline Phosphatase: 69 IU/L (ref 44–121)
BUN/Creatinine Ratio: 13 (ref 9–23)
BUN: 12 mg/dL (ref 6–24)
Bilirubin Total: <0.2 mg/dL (ref 0.0–1.2)
CO2: 24 mmol/L (ref 20–29)
Calcium: 9.3 mg/dL (ref 8.7–10.2)
Chloride: 100 mmol/L (ref 96–106)
Creatinine, Ser: 0.91 mg/dL (ref 0.57–1.00)
Globulin, Total: 2.4 g/dL (ref 1.5–4.5)
Glucose: 89 mg/dL (ref 70–99)
Potassium: 4.2 mmol/L (ref 3.5–5.2)
Sodium: 139 mmol/L (ref 134–144)
Total Protein: 6.6 g/dL (ref 6.0–8.5)
eGFR: 77 mL/min/{1.73_m2} (ref >59)

## 2022-08-19 LAB — CBC WITH DIFFERENTIAL/PLATELET
Basophils Absolute: 0.1 10*3/uL (ref 0.0–0.2)
Basos: 2 %
EOS (ABSOLUTE): 0.5 10*3/uL — ABNORMAL HIGH (ref 0.0–0.4)
Eos: 8 %
Hematocrit: 41.4 % (ref 34.0–46.6)
Hemoglobin: 14.1 g/dL (ref 11.1–15.9)
Immature Grans (Abs): 0 10*3/uL (ref 0.0–0.1)
Immature Granulocytes: 0 %
Lymphocytes Absolute: 1.7 10*3/uL (ref 0.7–3.1)
Lymphs: 27 %
MCH: 32 pg (ref 26.6–33.0)
MCHC: 34.1 g/dL (ref 31.5–35.7)
MCV: 94 fL (ref 79–97)
Monocytes Absolute: 0.4 10*3/uL (ref 0.1–0.9)
Monocytes: 7 %
Neutrophils Absolute: 3.5 10*3/uL (ref 1.4–7.0)
Neutrophils: 56 %
Platelets: 278 10*3/uL (ref 150–450)
RBC: 4.4 x10E6/uL (ref 3.77–5.28)
RDW: 12.5 % (ref 11.7–15.4)
WBC: 6.2 10*3/uL (ref 3.4–10.8)

## 2022-08-19 LAB — LIPID PANEL
Chol/HDL Ratio: 3 ratio (ref 0.0–4.4)
Cholesterol, Total: 204 mg/dL — ABNORMAL HIGH (ref 100–199)
HDL: 69 mg/dL (ref >39)
LDL Chol Calc (NIH): 116 mg/dL — ABNORMAL HIGH (ref 0–99)
Triglycerides: 107 mg/dL (ref 0–149)
VLDL Cholesterol Cal: 19 mg/dL (ref 5–40)

## 2022-08-19 LAB — THYROID PANEL WITH TSH
Free Thyroxine Index: 2 (ref 1.2–4.9)
T3 Uptake Ratio: 27 % (ref 24–39)
T4, Total: 7.4 ug/dL (ref 4.5–12.0)
TSH: 6.54 u[IU]/mL — ABNORMAL HIGH (ref 0.450–4.500)

## 2022-08-19 LAB — VITAMIN D 25 HYDROXY (VIT D DEFICIENCY, FRACTURES): Vit D, 25-Hydroxy: 33.7 ng/mL (ref 30.0–100.0)

## 2022-08-26 ENCOUNTER — Telehealth: Payer: Self-pay | Admitting: Nurse Practitioner

## 2022-08-26 ENCOUNTER — Other Ambulatory Visit: Payer: Self-pay | Admitting: Nurse Practitioner

## 2022-08-26 MED ORDER — LEVOTHYROXINE SODIUM 100 MCG PO TABS: 100.0000 ug | ORAL_TABLET | Freq: Every day | ORAL | 1 refills | Status: DC

## 2022-08-26 NOTE — Progress Notes (Signed)
Increase levothyroxin dose  Meds ordered this encounter  Medications   levothyroxine (SYNTHROID) 100 MCG tablet    Sig: Take 1 tablet (100 mcg total) by mouth daily.    Dispense:  90 tablet    Refill:  1    Order Specific Question:   Supervising Provider    Answer:   Chase Picket [[id number]]   [place], FNP

## 2022-08-26 NOTE — Telephone Encounter (Signed)
Patient aware that Lynn Golden is off today and aware that we will call her tomorrow with changes.

## 2022-08-26 NOTE — Telephone Encounter (Signed)
Yes increasing dose- will send in new precsirpion

## 2022-08-26 NOTE — Telephone Encounter (Signed)
Patient calling to find out if PCP is planning to change her thyroid medication due to her most recent lab work. Please call back.

## 2022-08-26 NOTE — Telephone Encounter (Signed)
Pt aware.

## 2022-08-27 ENCOUNTER — Other Ambulatory Visit: Payer: Self-pay | Admitting: Nurse Practitioner

## 2022-08-27 DIAGNOSIS — Z1231 Encounter for screening mammogram for malignant neoplasm of breast: Secondary | ICD-10-CM

## 2022-09-09 ENCOUNTER — Ambulatory Visit
Admission: RE | Admit: 2022-09-09 | Discharge: 2022-09-09 | Disposition: A | Discharge status: Home / Self Care | Payer: Managed Care, Other (non HMO) | Source: Ambulatory Visit | Attending: Nurse Practitioner | Admitting: Nurse Practitioner

## 2022-09-09 DIAGNOSIS — Z1231 Encounter for screening mammogram for malignant neoplasm of breast: Secondary | ICD-10-CM

## 2022-11-24 ENCOUNTER — Other Ambulatory Visit: Payer: Self-pay | Admitting: Nurse Practitioner

## 2022-11-24 DIAGNOSIS — J440 Chronic obstructive pulmonary disease with acute lower respiratory infection: Secondary | ICD-10-CM

## 2022-11-24 DIAGNOSIS — R52 Pain, unspecified: Secondary | ICD-10-CM

## 2023-01-22 ENCOUNTER — Other Ambulatory Visit: Payer: Self-pay | Admitting: Nurse Practitioner

## 2023-01-22 DIAGNOSIS — J209 Acute bronchitis, unspecified: Secondary | ICD-10-CM

## 2023-01-22 DIAGNOSIS — F5101 Primary insomnia: Secondary | ICD-10-CM

## 2023-02-16 ENCOUNTER — Ambulatory Visit: Payer: Managed Care, Other (non HMO) | Admitting: Nurse Practitioner

## 2023-02-18 ENCOUNTER — Encounter: Payer: Self-pay | Admitting: Nurse Practitioner

## 2023-02-18 ENCOUNTER — Ambulatory Visit (INDEPENDENT_AMBULATORY_CARE_PROVIDER_SITE_OTHER): Payer: Managed Care, Other (non HMO) | Admitting: Nurse Practitioner

## 2023-02-18 VITALS — BP 133/86 | HR 89 | Temp 97.8°F | Resp 20 | Ht 66.0 in | Wt 188.0 lb

## 2023-02-18 DIAGNOSIS — Z6831 Body mass index (BMI) 31.0-31.9, adult: Secondary | ICD-10-CM

## 2023-02-18 DIAGNOSIS — K219 Gastro-esophageal reflux disease without esophagitis: Secondary | ICD-10-CM | POA: Diagnosis not present

## 2023-02-18 DIAGNOSIS — F3342 Major depressive disorder, recurrent, in full remission: Secondary | ICD-10-CM

## 2023-02-18 DIAGNOSIS — J209 Acute bronchitis, unspecified: Secondary | ICD-10-CM

## 2023-02-18 DIAGNOSIS — E039 Hypothyroidism, unspecified: Secondary | ICD-10-CM | POA: Diagnosis not present

## 2023-02-18 DIAGNOSIS — J440 Chronic obstructive pulmonary disease with acute lower respiratory infection: Secondary | ICD-10-CM

## 2023-02-18 DIAGNOSIS — F5101 Primary insomnia: Secondary | ICD-10-CM

## 2023-02-18 DIAGNOSIS — I10 Essential (primary) hypertension: Secondary | ICD-10-CM

## 2023-02-18 DIAGNOSIS — R52 Pain, unspecified: Secondary | ICD-10-CM

## 2023-02-18 MED ORDER — GABAPENTIN 100 MG PO CAPS: 100.0000 mg | ORAL_CAPSULE | Freq: Two times a day (BID) | ORAL | 0 refills | Status: DC

## 2023-02-18 MED ORDER — LEVOTHYROXINE SODIUM 100 MCG PO TABS
100.0000 ug | ORAL_TABLET | Freq: Every day | ORAL | 1 refills | Status: DC
Start: 2023-02-18 — End: 2023-11-08

## 2023-02-18 MED ORDER — TRAZODONE HCL 100 MG PO TABS
100.0000 mg | ORAL_TABLET | Freq: Every day | ORAL | 1 refills | Status: DC
Start: 2023-02-18 — End: 2023-11-08

## 2023-02-18 MED ORDER — FLUTICASONE-SALMETEROL 100-50 MCG/ACT IN AEPB
1.0000 | INHALATION_SPRAY | Freq: Two times a day (BID) | RESPIRATORY_TRACT | 1 refills | Status: DC
Start: 2023-02-18 — End: 2023-09-06

## 2023-02-18 MED ORDER — PANTOPRAZOLE SODIUM 40 MG PO TBEC
40.0000 mg | DELAYED_RELEASE_TABLET | Freq: Every day | ORAL | 1 refills | Status: DC
Start: 2023-02-18 — End: 2023-11-08

## 2023-02-18 MED ORDER — LISINOPRIL 40 MG PO TABS
ORAL_TABLET | ORAL | 1 refills | Status: DC
Start: 2023-02-18 — End: 2023-09-06

## 2023-02-18 MED ORDER — CITALOPRAM HYDROBROMIDE 40 MG PO TABS: 40.0000 mg | ORAL_TABLET | Freq: Every day | ORAL | 1 refills | Status: DC

## 2023-02-18 NOTE — Patient Instructions (Signed)

## 2023-02-18 NOTE — Progress Notes (Signed)
Subjective:    Patient ID: Lynn Golden, female    DOB: May 08, 1971, 52 y.o.   MRN: [id number]   Chief Complaint: Medical Management of Chronic Issues    HPI:  Lynn Golden is a 52 y.o. who identifies as a female who was assigned female at birth.   Social history: Lives with: lives by herself now Work history: [employer]   Comes in today for follow up of the following chronic medical issues:  1. Primary hypertension No c/o chest pain, sob or headache. Does not check blood pressure at home. BP Readings from Last 3 Encounters:  02/18/23 133/86  08/18/22 128/87  02/24/22 123/89     2. Acquired hypothyroidism No issues that she is aware of. No change was made to meds at last appointment. Lab Results  Component Value Date   TSH 6.540 (H) 08/18/2022     3. COPD (chronic obstructive pulmonary disease) with acute bronchitis (HCC) Is on advair daily and uses albuterol several times a month. She is still smoking  4. GERD without esophagitis Is on protonix daily  5. Recurrent major depressive disorder, in full remission (HCC) Is on celexa and is doing well.    02/18/2023    1:51 PM 08/18/2022    1:55 PM 02/24/2022    2:28 PM  Depression screen PHQ 2/9  Decreased Interest 2 1 2   Down, Depressed, Hopeless 2 1 1   PHQ - 2 Score 4 2 3   Altered sleeping 2 3 0  Tired, decreased energy 3 1 2   Change in appetite 2 0 0  Feeling bad or failure about yourself  2 1 1   Trouble concentrating 3 2 1   Moving slowly or fidgety/restless 2 1 1   Suicidal thoughts 1 0 0  PHQ-9 Score 19 10 8   Difficult doing work/chores Very difficult Somewhat difficult Somewhat difficult     6. Primary insomnia Is on trazadone to sleep. Sleeps about 8-8 hours  7. BMI 31.0-31.9,adult No recent weight changes   New complaints: None  today  Allergies  Allergen Reactions   Tylenol With Codeine #3 [Acetaminophen-Codeine]    Outpatient Encounter Medications as of 02/18/2023  Medication Sig    albuterol (VENTOLIN HFA) 108 (90 Base) MCG/ACT inhaler USE 2 PUFFS EVERY 6 HOURS AS NEEDED   citalopram (CELEXA) 20 MG tablet TAKE ONE (1) TABLET BY MOUTH EVERY DAY   fluticasone-salmeterol (ADVAIR) 100-50 MCG/ACT AEPB USE 1 INHALATION TWICE DAILY   gabapentin (NEURONTIN) 100 MG capsule TAKE ONE (1) CAPSULE BY MOUTH 2 TIMES DAILY   levothyroxine (SYNTHROID) 100 MCG tablet TAKE ONE (1) TABLET BY MOUTH EVERY DAY   lisinopril (ZESTRIL) 40 MG tablet TAKE ONE (1) TABLET BY MOUTH EVERY DAY   pantoprazole (PROTONIX) 40 MG tablet Take 1 tablet (40 mg total) by mouth daily.   traZODone (DESYREL) 100 MG tablet TAKE ONE TABLET BY MOUTH AT BEDTIME   [DISCONTINUED] rosuvastatin (CRESTOR) 10 MG tablet Take 1 tablet (10 mg total) by mouth daily.   [DISCONTINUED] furosemide (LASIX) 20 MG tablet Take 1 tablet (20 mg total) by mouth daily.   No facility-administered encounter medications on file as of 02/18/2023.    Past Surgical History:  Procedure Laterality Date   BACK SURGERY     HERNIA REPAIR     TUBAL LIGATION      Family History  Problem Relation Age of Onset   Heart disease Mother    Cancer Mother        lung  Controlled substance contract: n/a     Review of Systems  Constitutional:  Negative for diaphoresis.  Eyes:  Negative for pain.  Respiratory:  Negative for shortness of breath.   Cardiovascular:  Negative for chest pain, palpitations and leg swelling.  Gastrointestinal:  Negative for abdominal pain.  Endocrine: Negative for polydipsia.  Skin:  Negative for rash.  Neurological:  Negative for dizziness, weakness and headaches.  Hematological:  Does not bruise/bleed easily.  All other systems reviewed and are negative.      Objective:   Physical Exam Vitals and nursing note reviewed.  Constitutional:      General: She is not in acute distress.    Appearance: Normal appearance. She is well-developed.  HENT:     Head: Normocephalic.     Right Ear: Tympanic  membrane normal.     Left Ear: Tympanic membrane normal.     Nose: Nose normal.     Mouth/Throat:     Mouth: Mucous membranes are moist.  Eyes:     Pupils: Pupils are equal, round, and reactive to light.  Neck:     Vascular: No carotid bruit or JVD.  Cardiovascular:     Rate and Rhythm: Normal rate and regular rhythm.     Heart sounds: Normal heart sounds.  Pulmonary:     Effort: Pulmonary effort is normal. No respiratory distress.     Breath sounds: Normal breath sounds. No wheezing (exp wheezes throughout) or rales.  Chest:     Chest wall: No tenderness.  Abdominal:     General: Bowel sounds are normal. There is no distension or abdominal bruit.     Palpations: Abdomen is soft. There is no hepatomegaly, splenomegaly, mass or pulsatile mass.     Tenderness: There is no abdominal tenderness.  Musculoskeletal:        General: Normal range of motion.     Cervical back: Normal range of motion and neck supple.  Lymphadenopathy:     Cervical: No cervical adenopathy.  Skin:    General: Skin is warm and dry.  Neurological:     Mental Status: She is alert and oriented to person, place, and time.     Deep Tendon Reflexes: Reflexes are normal and symmetric.  Psychiatric:        Behavior: Behavior normal.        Thought Content: Thought content normal.        Judgment: Judgment normal.    BP 133/86   Pulse 89   Temp 97.8 F (36.6 C) (Temporal)   Resp 20   Ht 5\' 6"  (1.676 m)   Wt 188 lb (85.3 kg)   SpO2 94%   BMI 30.34 kg/m         Assessment & Plan:  Savi Lastinger comes in today with chief complaint of Medical Management of Chronic Issues   Diagnosis and orders addressed:  1. Primary hypertension Low sodium diet - lisinopril (ZESTRIL) 40 MG tablet; TAKE ONE (1) TABLET BY MOUTH EVERY DAY  Dispense: 90 tablet; Refill: 1 - CBC with Differential/Platelet - CMP14+EGFR - Lipid panel  2. Acquired hypothyroidism Labs pending - levothyroxine (SYNTHROID) 100 MCG  tablet; Take 1 tablet (100 mcg total) by mouth daily.  Dispense: 90 tablet; Refill: 1 - Thyroid Panel With TSH  3. COPD (chronic obstructive pulmonary disease) with acute bronchitis (HCC) Smoking cessation encouraged - fluticasone-salmeterol (ADVAIR) 100-50 MCG/ACT AEPB; Inhale 1 puff into the lungs 2 (two) times daily.  Dispense: 180 each; Refill: 1  4. GERD without esophagitis Avoid spicy foods Do not eat 2 hours prior to bedtime  - pantoprazole (PROTONIX) 40 MG tablet; Take 1 tablet (40 mg total) by mouth daily.  Dispense: 90 tablet; Refill: 1  5. Recurrent major depressive disorder, in full remission (HCC) Stress management Increased celexa to 40mg  daily - citalopram (CELEXA) 40 MG tablet; Take 1 tablet (40 mg total) by mouth daily.  Dispense: 90 tablet; Refill: 1  6. Primary insomnia Bedtime routine - traZODone (DESYREL) 100 MG tablet; Take 1 tablet (100 mg total) by mouth at bedtime.  Dispense: 90 tablet; Refill: 1  7. BMI 31.0-31.9,adult Discussed diet and exercise for person with BMI >25 Will recheck weight in 3-6 months   8. Body aches - gabapentin (NEURONTIN) 100 MG capsule; Take 1 capsule (100 mg total) by mouth 2 (two) times daily.  Dispense: 180 capsule; Refill: 0   Labs pending Health Maintenance reviewed Diet and exercise encouraged  Follow up plan: 6 months   Mary-Margaret Daphine Deutscher, FNP

## 2023-02-19 LAB — CBC WITH DIFFERENTIAL/PLATELET
Basophils Absolute: 0.1 10*3/uL (ref 0.0–0.2)
Basos: 2 %
EOS (ABSOLUTE): 0.4 10*3/uL (ref 0.0–0.4)
Eos: 7 %
Hematocrit: 50.8 % — ABNORMAL HIGH (ref 34.0–46.6)
Hemoglobin: 17.2 g/dL — ABNORMAL HIGH (ref 11.1–15.9)
Immature Grans (Abs): 0 10*3/uL (ref 0.0–0.1)
Immature Granulocytes: 0 %
Lymphocytes Absolute: 1.8 10*3/uL (ref 0.7–3.1)
Lymphs: 37 %
MCH: 32.3 pg (ref 26.6–33.0)
MCHC: 33.9 g/dL (ref 31.5–35.7)
MCV: 95 fL (ref 79–97)
Monocytes Absolute: 0.4 10*3/uL (ref 0.1–0.9)
Monocytes: 9 %
Neutrophils Absolute: 2.3 10*3/uL (ref 1.4–7.0)
Neutrophils: 45 %
Platelets: 313 10*3/uL (ref 150–450)
RBC: 5.33 x10E6/uL — ABNORMAL HIGH (ref 3.77–5.28)
RDW: 12.8 % (ref 11.7–15.4)
WBC: 5 10*3/uL (ref 3.4–10.8)

## 2023-02-19 LAB — CMP14+EGFR
ALT: 13 IU/L (ref 0–32)
AST: 15 IU/L (ref 0–40)
Albumin: 4.1 g/dL (ref 3.9–4.9)
Alkaline Phosphatase: 81 IU/L (ref 44–121)
BUN/Creatinine Ratio: 9 (ref 9–23)
BUN: 9 mg/dL (ref 6–24)
Bilirubin Total: 0.4 mg/dL (ref 0.0–1.2)
CO2: 24 mmol/L (ref 20–29)
Calcium: 9.6 mg/dL (ref 8.7–10.2)
Chloride: 101 mmol/L (ref 96–106)
Creatinine, Ser: 1.02 mg/dL — ABNORMAL HIGH (ref 0.57–1.00)
Globulin, Total: 2.3 g/dL (ref 1.5–4.5)
Glucose: 94 mg/dL (ref 70–99)
Potassium: 4.6 mmol/L (ref 3.5–5.2)
Sodium: 139 mmol/L (ref 134–144)
Total Protein: 6.4 g/dL (ref 6.0–8.5)
eGFR: 67 mL/min/{1.73_m2} (ref >59)

## 2023-02-19 LAB — LIPID PANEL
Chol/HDL Ratio: 2.6 ratio (ref 0.0–4.4)
Cholesterol, Total: 206 mg/dL — ABNORMAL HIGH (ref 100–199)
HDL: 79 mg/dL (ref >39)
LDL Chol Calc (NIH): 115 mg/dL — ABNORMAL HIGH (ref 0–99)
Triglycerides: 69 mg/dL (ref 0–149)
VLDL Cholesterol Cal: 12 mg/dL (ref 5–40)

## 2023-02-19 LAB — THYROID PANEL WITH TSH
Free Thyroxine Index: 2.4 (ref 1.2–4.9)
T3 Uptake Ratio: 32 % (ref 24–39)
T4, Total: 7.6 ug/dL (ref 4.5–12.0)
TSH: 2.14 u[IU]/mL (ref 0.450–4.500)

## 2023-03-04 ENCOUNTER — Other Ambulatory Visit: Payer: Self-pay | Admitting: Nurse Practitioner

## 2023-03-04 DIAGNOSIS — E039 Hypothyroidism, unspecified: Secondary | ICD-10-CM

## 2023-03-04 DIAGNOSIS — F3342 Major depressive disorder, recurrent, in full remission: Secondary | ICD-10-CM

## 2023-05-04 ENCOUNTER — Other Ambulatory Visit: Payer: Self-pay | Admitting: Nurse Practitioner

## 2023-05-04 DIAGNOSIS — J209 Acute bronchitis, unspecified: Secondary | ICD-10-CM

## 2023-05-07 ENCOUNTER — Other Ambulatory Visit: Payer: Self-pay | Admitting: Nurse Practitioner

## 2023-05-07 DIAGNOSIS — Z1211 Encounter for screening for malignant neoplasm of colon: Secondary | ICD-10-CM

## 2023-05-07 DIAGNOSIS — Z1212 Encounter for screening for malignant neoplasm of rectum: Secondary | ICD-10-CM

## 2023-07-10 ENCOUNTER — Other Ambulatory Visit: Payer: Self-pay | Admitting: Nurse Practitioner

## 2023-07-10 DIAGNOSIS — R52 Pain, unspecified: Secondary | ICD-10-CM

## 2023-08-05 ENCOUNTER — Other Ambulatory Visit: Payer: Self-pay | Admitting: Nurse Practitioner

## 2023-08-05 DIAGNOSIS — F3342 Major depressive disorder, recurrent, in full remission: Secondary | ICD-10-CM

## 2023-08-13 ENCOUNTER — Institutional Professional Consult (permissible substitution): Payer: Managed Care, Other (non HMO) | Admitting: Internal Medicine

## 2023-08-23 ENCOUNTER — Ambulatory Visit: Payer: Managed Care, Other (non HMO) | Admitting: Nurse Practitioner

## 2023-08-23 ENCOUNTER — Encounter: Payer: Self-pay | Admitting: Nurse Practitioner

## 2023-09-03 NOTE — Progress Notes (Unsigned)
Lynn Golden, female    DOB: Feb 26, 1971    MRN: [id number]   Brief patient profile:  53  yowm  active smoker  LPN/ charge nurse for behavior health/UC  referred to pulmonary clinic in [place]  09/06/2023 by Reggy Eye  for copd eval.   While working at [employer] 2004 had reduction in airflow down to 70%    History of Present Illness  09/06/2023  Pulmonary/ 1st office eval/ Joanne Brander / [employer] [facility] Advair 100 bid /ACEi  - no pred x one year  Chief Complaint  Patient presents with   Consult    Shortness of Breathe and copd   Dyspnea:  one flight and stops at top/ flat surface does fine but freq wheezing  Cough: one hour of am cough / congestion > slt beige  Sleep: bed is flat/ 2 pillows s resp cc  SABA use: avg 2-3 x per day  02: none  LDSCT:  referred 09/06/2023   No obvious day to day or daytime pattern/variability or assoc  mucus plugs or hemoptysis or cp or chest tightness,   or overt sinus or hb symptoms.    Also denies any obvious fluctuation of symptoms with weather or environmental changes or other aggravating or alleviating factors except as outlined above   No unusual exposure hx or h/o childhood pna/ asthma or knowledge of premature birth.  Current Allergies, Complete Past Medical History, Past Surgical History, Family History, and Social History were reviewed in [employer] record.  ROS  The following are not active complaints unless bolded Hoarseness, sore throat, dysphagia, dental problems, itching, sneezing,  nasal congestion or discharge of excess mucus or purulent secretions, ear ache,   fever, chills, sweats, unintended wt loss or wt gain, classically pleuritic or exertional cp,  orthopnea pnd or arm/hand swelling  or leg swelling, presyncope, palpitations, abdominal pain, anorexia, nausea, vomiting, diarrhea  or change in bowel habits or change in bladder habits, change in stools or change in urine, dysuria, hematuria,  rash, arthralgias,  visual complaints, headache, numbness, weakness or ataxia or problems with walking or coordination,  change in mood or  memory.            Outpatient Medications Prior to Visit  Medication Sig Dispense Refill   albuterol (VENTOLIN HFA) 108 (90 Base) MCG/ACT inhaler USE 2 PUFFS EVERY 6 HOURS AS NEEDED 8.5 g 2   citalopram (CELEXA) 40 MG tablet TAKE ONE (1) TABLET BY MOUTH EVERY DAY 90 tablet 0   fluticasone-salmeterol (ADVAIR) 100-50 MCG/ACT AEPB Inhale 1 puff into the lungs 2 (two) times daily. 180 each 1   gabapentin (NEURONTIN) 100 MG capsule TAKE ONE (1) CAPSULE BY MOUTH 2 TIMES DAILY 180 capsule 0   levothyroxine (SYNTHROID) 100 MCG tablet Take 1 tablet (100 mcg total) by mouth daily. 90 tablet 1   lisinopril (ZESTRIL) 40 MG tablet TAKE ONE (1) TABLET BY MOUTH EVERY DAY 90 tablet 1   pantoprazole (PROTONIX) 40 MG tablet Take 1 tablet (40 mg total) by mouth daily. 90 tablet 1   traZODone (DESYREL) 100 MG tablet Take 1 tablet (100 mg total) by mouth at bedtime. 90 tablet 1   No facility-administered medications prior to visit.    Past Medical History:  Diagnosis Date   Cancer (HCC)    Melanoma   COPD (chronic obstructive pulmonary disease) (HCC)    Generalized anxiety disorder       Objective:     BP (!) 178/109  Pulse 92   Ht 5\' 6"  (1.676 m)   Wt 176 lb 9.6 oz (80.1 kg)   SpO2 93% Comment: room air  BMI 28.50 kg/m   SpO2: 93 % (room air)  Amb pleasant wf nad   HEENT : Oropharynx  clear   Nasal turbinates nl    NECK :  without  apparent JVD/ palpable Nodes/TM    LUNGS: no acc muscle use,  Mild barrel  contour chest wall with bilateral  insp squeaks/ exp wheeze and  without cough on insp or exp maneuvers  and mild  Hyperresonant  to  percussion bilaterally     CV:  RRR  no s3 or murmur or increase in P2, and no edema   ABD:  soft and nontender with pos end  insp Hoover's  in the supine position.  No bruits or organomegaly appreciated   MS:  Nl gait/ ext warm  without deformities Or obvious joint restrictions  calf tenderness, cyanosis or clubbing     SKIN: warm and dry without lesions    NEURO:  alert, approp, nl sensorium with  no motor or cerebellar deficits apparent.        I personally reviewed images and agree with radiology impression as follows:  CXR:   pa and lateral 08/18/22 n C/w copd  Assessment   Asthmatic bronchitis , chronic (HCC) Active smoker  - Allergy screen 09/06/2023 >  Eos 0. /  alpha on AT phenotype  - 09/06/2023  After extensive coaching inhaler device,  effectiveness =    80% hfa > breztri plus d/c ACEi   Group D (now reclassified as E) in terms of symptom/risk and laba/lama/ICS  therefore appropriate rx at this point >>>  breztri trial plus more approp saba pending f/u with PFTs  Re SABA :  I spent extra time with pt today reviewing appropriate use of albuterol for prn use on exertion with the following points: 1) saba is for relief of sob that does not improve by walking a slower pace or resting but rather if the pt does not improve after trying this first. 2) If the pt is convinced, as many are, that saba helps recover from activity faster then it's easy to tell if this is the case by re-challenging : ie stop, take the inhaler, then p 5 minutes try the exact same activity (intensity of workload) that just caused the symptoms and see if they are substantially diminished or not after saba 3) if there is an activity that reproducibly causes the symptoms, try the saba 15 min before the activity on alternate days   If in fact the saba really does help, then fine to continue to use it prn but advised may need to look closer at the maintenance regimen being used to achieve better control of airways disease with exertion.     Cigarette smoker Counseled re importance of smoking cessation but did not meet time criteria for separate billing    Low-dose CT lung cancer screening is recommended for patients who are 50-80 years of  age with a 20+ pack-year history of smoking and who are currently smoking or quit <=15 years ago. No coughing up blood  No unintentional weight loss of > 15 pounds in the last 6 months - pt is eligible for scanning yearly until 52 y p quits smoking >  referred 09/06/2023      Primary hypertension Change ACE to ARB 09/06/2023 due to cough     In the  best review of chronic cough to date ( NEJM 2016 375 [id number]) ,  ACEi are now felt to cause cough in up to  20% of pts which is a 4 fold increase from previous reports and does not include the variety of non-specific complaints we see in pulmonary clinic in pts on ACEi but previously attributed to another dx like  Copd/asthma and  include PNDS, throat and chest congestion, "bronchitis", unexplained dyspnea and noct "strangling" sensations, and hoarseness, but also  atypical /refractory GERD symptoms like sore throat/dysphagia and "bad heartburn"  - these upper airway symptoms may also be exaggerated by DPI inhalers.   The only way I know  to prove this is not an "ACEi Case" is a trial off ACEi and DPI inhalers x a minimum of 6 weeks then regroup.   >>> try olmesartan 40/25 mg daily since also having tendency to lower ext swelling           Each maintenance medication was reviewed in detail including emphasizing most importantly the difference between maintenance and prns and under what circumstances the prns are to be triggered using an action plan format where appropriate.  Total time for H and P, chart review, counseling, reviewing hfa/dpi device(s) and generating customized AVS unique to this office visit / same day charting = 45 min new pt eval.           Sandrea Hughs, MD 09/06/2023

## 2023-09-06 ENCOUNTER — Encounter: Payer: Self-pay | Admitting: Internal Medicine

## 2023-09-06 ENCOUNTER — Ambulatory Visit: Payer: Managed Care, Other (non HMO) | Admitting: Internal Medicine

## 2023-09-06 VITALS — BP 178/109 | HR 92 | Ht 66.0 in | Wt 176.6 lb

## 2023-09-06 DIAGNOSIS — I10 Essential (primary) hypertension: Secondary | ICD-10-CM | POA: Diagnosis not present

## 2023-09-06 DIAGNOSIS — J4489 Other specified chronic obstructive pulmonary disease: Secondary | ICD-10-CM | POA: Diagnosis not present

## 2023-09-06 DIAGNOSIS — F17210 Nicotine dependence, cigarettes, uncomplicated: Secondary | ICD-10-CM | POA: Diagnosis not present

## 2023-09-06 DIAGNOSIS — J449 Chronic obstructive pulmonary disease, unspecified: Secondary | ICD-10-CM | POA: Insufficient documentation

## 2023-09-06 MED ORDER — OLMESARTAN MEDOXOMIL-HCTZ 40-25 MG PO TABS: 1.0000 | ORAL_TABLET | Freq: Every day | ORAL | 11 refills | Status: DC

## 2023-09-06 MED ORDER — BREZTRI AEROSPHERE 160-9-4.8 MCG/ACT IN AERO: 2.0000 | INHALATION_SPRAY | Freq: Two times a day (BID) | RESPIRATORY_TRACT | Status: DC

## 2023-09-06 MED ORDER — BREZTRI AEROSPHERE 160-9-4.8 MCG/ACT IN AERO: INHALATION_SPRAY | RESPIRATORY_TRACT | 11 refills | Status: AC

## 2023-09-06 NOTE — Assessment & Plan Note (Signed)
Change ACE to ARB 09/06/2023 due to cough     In the best review of chronic cough to date ( NEJM 2016 375 [id number]) ,  ACEi are now felt to cause cough in up to  20% of pts which is a 4 fold increase from previous reports and does not include the variety of non-specific complaints we see in pulmonary clinic in pts on ACEi but previously attributed to another dx like  Copd/asthma and  include PNDS, throat and chest congestion, "bronchitis", unexplained dyspnea and noct "strangling" sensations, and hoarseness, but also  atypical /refractory GERD symptoms like sore throat/dysphagia and "bad heartburn"  - these upper airway symptoms may also be exaggerated by DPI inhalers.   The only way I know  to prove this is not an "ACEi Case" is a trial off ACEi and DPI inhalers x a minimum of 6 weeks then regroup.   >>> try olmesartan 40/25 mg daily since also having tendency to lower ext swelling           Each maintenance medication was reviewed in detail including emphasizing most importantly the difference between maintenance and prns and under what circumstances the prns are to be triggered using an action plan format where appropriate.  Total time for H and P, chart review, counseling, reviewing hfa/dpi device(s) and generating customized AVS unique to this office visit / same day charting = 45 min new pt eval.

## 2023-09-06 NOTE — Patient Instructions (Addendum)
Plan A = Automatic = Always=  Breztri Take 2 puffs first thing in am and then another 2 puffs about 12 hours later.    Work on inhaler technique:  relax and gently blow all the way out then take a nice smooth full deep breath back in, triggering the inhaler at same time you start breathing in.  Hold breath in for at least  5 seconds if you can. Blow out breztri  thru nose. Rinse and gargle with water when done.  If mouth or throat bother you at all,  try brushing teeth/gums/tongue with arm and hammer toothpaste/ make a slurry and gargle and spit out.      Plan B = Backup (to supplement plan A, not to replace it) Only use your albuterol inhaler as a rescue medication to be used if you can't catch your breath by resting or doing a relaxed purse lip breathing pattern.  - The less you use it, the better it will work when you need it. - Ok to use the inhaler up to 2 puffs  every 4 hours if you must but call for appointment if use goes up over your usual need - Don't leave home without it !!  (think of it like the spare tire for your car)     Stop lisinopril   Start olmesartan 40-25  one daily   Please remember to go to the lab department   for your tests - we will call you with the results when they are available.      Please schedule a follow up office visit in 6 weeks, call sooner if needed with PFTS on return

## 2023-09-06 NOTE — Assessment & Plan Note (Signed)
Counseled re importance of smoking cessation but did not meet time criteria for separate billing    Low-dose CT lung cancer screening is recommended for patients who are 59-52 years of age with a 20+ pack-year history of smoking and who are currently smoking or quit <=15 years ago. No coughing up blood  No unintentional weight loss of > 15 pounds in the last 6 months - pt is eligible for scanning yearly until 15 y p quits smoking >  referred 09/06/2023

## 2023-09-06 NOTE — Assessment & Plan Note (Addendum)
Active smoker  - Allergy screen 09/06/2023 >  Eos 0. /  alpha on AT phenotype  - 09/06/2023  After extensive coaching inhaler device,  effectiveness =    80% hfa > breztri plus d/c ACEi   Group D (now reclassified as E) in terms of symptom/risk and laba/lama/ICS  therefore appropriate rx at this point >>>  breztri trial plus more approp saba pending f/u with PFTs  Re SABA :  I spent extra time with pt today reviewing appropriate use of albuterol for prn use on exertion with the following points: 1) saba is for relief of sob that does not improve by walking a slower pace or resting but rather if the pt does not improve after trying this first. 2) If the pt is convinced, as many are, that saba helps recover from activity faster then it's easy to tell if this is the case by re-challenging : ie stop, take the inhaler, then p 5 minutes try the exact same activity (intensity of workload) that just caused the symptoms and see if they are substantially diminished or not after saba 3) if there is an activity that reproducibly causes the symptoms, try the saba 15 min before the activity on alternate days   If in fact the saba really does help, then fine to continue to use it prn but advised may need to look closer at the maintenance regimen being used to achieve better control of airways disease with exertion.

## 2023-09-21 LAB — CBC WITH DIFFERENTIAL/PLATELET
Basophils Absolute: 0.1 10*3/uL (ref 0.0–0.2)
Basos: 1 %
EOS (ABSOLUTE): 0.2 10*3/uL (ref 0.0–0.4)
Eos: 3 %
Hematocrit: 47 % — ABNORMAL HIGH (ref 34.0–46.6)
Hemoglobin: 16 g/dL — ABNORMAL HIGH (ref 11.1–15.9)
Immature Grans (Abs): 0 10*3/uL (ref 0.0–0.1)
Immature Granulocytes: 0 %
Lymphocytes Absolute: 1.2 10*3/uL (ref 0.7–3.1)
Lymphs: 24 %
MCH: 33.6 pg — ABNORMAL HIGH (ref 26.6–33.0)
MCHC: 34 g/dL (ref 31.5–35.7)
MCV: 99 fL — ABNORMAL HIGH (ref 79–97)
Monocytes Absolute: 0.6 10*3/uL (ref 0.1–0.9)
Monocytes: 11 %
Neutrophils Absolute: 3.1 10*3/uL (ref 1.4–7.0)
Neutrophils: 61 %
Platelets: 287 10*3/uL (ref 150–450)
RBC: 4.76 x10E6/uL (ref 3.77–5.28)
RDW: 12.8 % (ref 11.7–15.4)
WBC: 5.1 10*3/uL (ref 3.4–10.8)

## 2023-09-21 LAB — ALPHA-1-ANTITRYPSIN PHENOTYP: A-1 Antitrypsin: 136 mg/dL (ref 101–187)

## 2023-09-27 ENCOUNTER — Telehealth: Payer: Self-pay | Admitting: Acute Care

## 2023-09-27 ENCOUNTER — Other Ambulatory Visit: Payer: Self-pay | Admitting: Emergency Medicine

## 2023-09-27 DIAGNOSIS — Z122 Encounter for screening for malignant neoplasm of respiratory organs: Secondary | ICD-10-CM

## 2023-09-27 DIAGNOSIS — Z87891 Personal history of nicotine dependence: Secondary | ICD-10-CM

## 2023-09-27 DIAGNOSIS — F17210 Nicotine dependence, cigarettes, uncomplicated: Secondary | ICD-10-CM

## 2023-09-27 NOTE — Telephone Encounter (Signed)
 Lung Cancer Screening Narrative/Criteria Questionnaire (Cigarette Smokers Only- No Cigars/Pipes/vapes)   Charlena Cross   SDMV:09/30/2023 at 10:30 with Baxter Hire   03/27/1971   LDCT: 10/06/2023 at 11:40 at [facility]     53 y.o.   Phone: [phone]  Lung Screening Narrative (confirm age 32-77 yrs Medicare / 50-80 yrs Private pay insurance)   Insurance information:[employer]    Referring Provider:Dr. Sherene Sires   This screening involves an initial phone call with a team member from our program. It is called a shared decision making visit. The initial meeting is required by  insurance and Medicare to make sure you understand the program. This appointment takes about 15-20 minutes to complete. You will complete the screening scan at your scheduled date/time.  This scan takes about 5-10 minutes to complete. You can eat and drink normally before and after the scan.  Criteria questions for Lung Cancer Screening:   Are you a current or former smoker? Current Age began smoking: 53yo   If you are a former smoker, what year did you quit smoking? N/A(within 15 yrs)   To calculate your smoking history, I need an accurate estimate of how many packs of cigarettes you smoked per day and for how many years. (Not just the number of PPD you are now smoking)   Years smoking 30 x Packs per day 1.75 = Pack years 52.5   (at least 20 pack yrs)   (Make sure they understand that we need to know how much they have smoked in the past, not just the number of PPD they are smoking now)  Do you have a personal history of cancer?  Yes - (type and when diagnosed - 5 yrs cancer free) Melanoma, dx in 2010 with resection and no rad/onc treatment.     Do you have a family history of cancer? Yes  (cancer type and and relative) Mother - lung. Father - lung. Maternal Uncle- lung. Paternal Uncle- Lung.   Are you coughing up blood?  No  Have you had unexplained weight loss of 15 lbs or more in the last 6 months? No  It looks  like you meet all criteria.  When would be a good time for [place] to schedule you for this screening?   Additional information: Patient has lost weight within the recent months: 02/2023 188lb, 09/2023 176lb. This is a 12lb weight loss in 7 months.

## 2023-09-30 ENCOUNTER — Ambulatory Visit (INDEPENDENT_AMBULATORY_CARE_PROVIDER_SITE_OTHER): Payer: Managed Care, Other (non HMO) | Admitting: Acute Care

## 2023-09-30 DIAGNOSIS — F17210 Nicotine dependence, cigarettes, uncomplicated: Secondary | ICD-10-CM | POA: Diagnosis not present

## 2023-09-30 NOTE — Progress Notes (Signed)
 Provider Attestation I agree with the documentation of the Shared Decision Making visit,  smoking cessation counseling if appropriate, and verification or eligibility for lung cancer screening as documented by the RN Nurse Navigator.   Raejean Bullock, MSN, AGACNP-BC [facility] See Amion for personal pager PCCM on call pager [phone]     Virtual Visit via Video Note  I connected with Lynn Golden on 09/30/23 at 10:30 AM EST by a video enabled telemedicine application and verified that I am speaking with the correct person using two identifiers.  Location: Patient: in home Provider: [address] [place], [place], [address]     Shared Decision Making Visit Lung Cancer Screening Program [phone])   Eligibility: Age 53 y.o. Pack Years Smoking History Calculation 52.5 (# packs/per year x # years smoked) Recent History of coughing up blood  no Unexplained weight loss? no ( >Than 15 pounds within the last 6 months ) Prior History Lung / other cancer yes (Diagnosis within the last 5 years already requiring surveillance chest CT Scans).greater than 5 years Smoking Status Current Smoker Former Smokers: Years since quit:  NA  Quit Date: NA  Visit Components: Discussion included one or more decision making aids. yes Discussion included risk/benefits of screening. yes Discussion included potential follow up diagnostic testing for abnormal scans. yes Discussion included meaning and risk of over diagnosis. yes Discussion included meaning and risk of False Positives. yes Discussion included meaning of total radiation exposure. yes  Counseling Included: Importance of adherence to annual lung cancer LDCT screening. yes Impact of comorbidities on ability to participate in the program. yes Ability and willingness to under diagnostic treatment. yes  Smoking Cessation Counseling: Current Smokers:  Discussed importance of smoking cessation.  yes Information about tobacco cessation classes and interventions provided to patient. yes Patient provided with "ticket" for LDCT Scan. yes Symptomatic Patient. no  Counseling NA Diagnosis Code: Tobacco Use Z72.0 Asymptomatic Patient yes  Counseling (Intermediate counseling: > three minutes counseling) E9528 Former Smokers:  Discussed the importance of maintaining cigarette abstinence. yes Diagnosis Code: Personal History of Nicotine Dependence. U13.244 Information about tobacco cessation classes and interventions provided to patient. Yes Patient provided with "ticket" for LDCT Scan. yes Written Order for Lung Cancer Screening with LDCT placed in Epic. Yes (CT Chest Lung Cancer Screening Low Dose W/O CM) [id number] Z12.2-Screening of respiratory organs Z87.891-Personal history of nicotine dependence   Valentin Gaskins, RN 09/30/23

## 2023-09-30 NOTE — Patient Instructions (Signed)

## 2023-10-05 ENCOUNTER — Telehealth: Payer: Self-pay | Admitting: Internal Medicine

## 2023-10-05 NOTE — Telephone Encounter (Signed)
 Atc pt back vm is full not sure what is needed this ct has been authorized

## 2023-10-05 NOTE — Telephone Encounter (Signed)
 This PT is having issues with her ins wanting more information before they Auth the CT tomorrow. She tried to call to cancel but kept getting the breast imaging when she called. Please call her when we can resched. Her # is [phone]

## 2023-10-06 ENCOUNTER — Other Ambulatory Visit: Payer: Managed Care, Other (non HMO)

## 2023-10-08 ENCOUNTER — Telehealth: Payer: Self-pay

## 2023-10-08 NOTE — Telephone Encounter (Signed)
 Copied from CRM [id number]. Topic: General - Other >> Oct 08, 2023 10:29 AM Macon Large wrote: Reason for CRM: Patient requests that a message be sent to [facility] asking her to return her call. Call back# [phone]

## 2023-10-08 NOTE — Telephone Encounter (Signed)
 Called and spoke with patient. She would like an appointment at the beginning of the week. Appt made for Monday

## 2023-10-08 NOTE — Telephone Encounter (Signed)
 Patient resc her appt

## 2023-10-11 ENCOUNTER — Encounter: Payer: Self-pay | Admitting: Nurse Practitioner

## 2023-10-11 ENCOUNTER — Ambulatory Visit (INDEPENDENT_AMBULATORY_CARE_PROVIDER_SITE_OTHER): Admitting: Nurse Practitioner

## 2023-10-11 VITALS — BP 110/71 | HR 96 | Temp 98.0°F | Ht 66.0 in | Wt 172.0 lb

## 2023-10-11 DIAGNOSIS — R591 Generalized enlarged lymph nodes: Secondary | ICD-10-CM

## 2023-10-11 MED ORDER — DOXYCYCLINE HYCLATE 100 MG PO TABS: 100.0000 mg | ORAL_TABLET | Freq: Two times a day (BID) | ORAL | 0 refills | Status: DC

## 2023-10-11 NOTE — Progress Notes (Signed)
   Subjective:    Patient ID: Lynn Golden, female    DOB: 03-13-1971, 53 y.o.   MRN: [id number]   Chief Complaint: Adenopathy   HPI  Patient comes in c/o swollen lymph nodes. She noticed them on Tuesday. The palpable nodes are in right forearm. Tender to touch. Feel hot o touch. No fever. Patient Active Problem List   Diagnosis Date Noted   Asthmatic bronchitis , chronic (HCC) 09/06/2023   Cigarette smoker 09/06/2023   Acquired hypothyroidism 10/13/2021   Primary hypertension 11/21/2020   Primary insomnia 11/21/2020   GERD without esophagitis 09/16/2020   Depression 06/01/2013   COPD (chronic obstructive pulmonary disease) with acute bronchitis (HCC) 06/01/2013       Review of Systems  Constitutional:  Negative for diaphoresis.  Eyes:  Negative for pain.  Respiratory:  Negative for shortness of breath.   Cardiovascular:  Negative for chest pain, palpitations and leg swelling.  Gastrointestinal:  Negative for abdominal pain.  Endocrine: Negative for polydipsia.  Skin:  Negative for rash.  Neurological:  Negative for dizziness, weakness and headaches.  Hematological:  Does not bruise/bleed easily.  All other systems reviewed and are negative.      Objective:   Physical Exam Constitutional:      Appearance: Normal appearance.  Cardiovascular:     Rate and Rhythm: Normal rate and regular rhythm.     Heart sounds: Normal heart sounds.  Pulmonary:     Effort: Pulmonary effort is normal.     Breath sounds: Normal breath sounds.  Skin:    General: Skin is warm and dry.     Comments: 5 cm tender palpable , swollen lymph nodes at right inner elbow area. Warm to touch  Neurological:     General: No focal deficit present.     Mental Status: She is alert and oriented to person, place, and time.  Psychiatric:        Mood and Affect: Mood normal.        Behavior: Behavior normal.     BP 110/71   Pulse 96   Temp 98 F (36.7 C) (Temporal)   Ht 5\' 6"  (1.676 m)    Wt 172 lb (78 kg)   SpO2 98%   BMI 27.76 kg/m        Assessment & Plan:   Lynn Golden in today with chief complaint of Adenopathy   1. Lymphadenopathy (Primary)- right elbow Keep check of area Moist heat Will do  U/S if not improving - doxycycline (VIBRA-TABS) 100 MG tablet; Take 1 tablet (100 mg total) by mouth 2 (two) times daily. 1 po bid  Dispense: 20 tablet; Refill: 0    The above assessment and management plan was discussed with the patient. The patient verbalized understanding of and has agreed to the management plan. Patient is aware to call the clinic if symptoms persist or worsen. Patient is aware when to return to the clinic for a follow-up visit. Patient educated on when it is appropriate to go to the emergency department.   Mary-Margaret Daphine Deutscher, FNP

## 2023-10-12 ENCOUNTER — Ambulatory Visit (HOSPITAL_COMMUNITY): Admission: RE | Admit: 2023-10-12 | Payer: Managed Care, Other (non HMO) | Source: Ambulatory Visit

## 2023-10-13 ENCOUNTER — Ambulatory Visit: Payer: Managed Care, Other (non HMO) | Admitting: Internal Medicine

## 2023-10-15 ENCOUNTER — Encounter: Payer: Self-pay | Admitting: Nurse Practitioner

## 2023-10-15 ENCOUNTER — Ambulatory Visit: Payer: Managed Care, Other (non HMO) | Admitting: Nurse Practitioner

## 2023-10-15 NOTE — Progress Notes (Deleted)
 Subjective:    Patient ID: Lynn Golden, female    DOB: 07-06-1971, 53 y.o.   MRN: [id number]   Chief Complaint: medical management of chronic issues     HPI:  Lynn Golden is a 53 y.o. who identifies as a female who was assigned female at birth.   Social history: Lives with: lives by herself now Work history: [employer]   Comes in today for follow up of the following chronic medical issues:  1. Primary hypertension No c/o chest pain, sob or headache. Does not check blood pressure at home. BP Readings from Last 3 Encounters:  10/11/23 110/71  09/06/23 (!) 178/109  02/18/23 133/86     2. Acquired hypothyroidism No issues that she is aware of. No change was made to meds at last appointment. Lab Results  Component Value Date   TSH 2.140 02/18/2023     3. COPD (chronic obstructive pulmonary disease) with acute bronchitis (HCC) Is on advair daily and uses albuterol several times a month. She is still smoking  4. GERD without esophagitis Is on protonix daily  5. Recurrent major depressive disorder, in full remission (HCC) Is on celexa and is doing well. ***   6. Primary insomnia Is on trazadone to sleep. Sleeps about 8-8 hours  7. BMI 31.0-31.9,adult No recent weight changes ***  New complaints: None  today  Allergies  Allergen Reactions   Tylenol With Codeine #3 [Acetaminophen-Codeine]    Outpatient Encounter Medications as of 10/15/2023  Medication Sig   albuterol (VENTOLIN HFA) 108 (90 Base) MCG/ACT inhaler USE 2 PUFFS EVERY 6 HOURS AS NEEDED   Budeson-Glycopyrrol-Formoterol (BREZTRI AEROSPHERE) 160-9-4.8 MCG/ACT AERO Take 2 puffs first thing in am and then another 2 puffs about 12 hours later.   citalopram (CELEXA) 40 MG tablet TAKE ONE (1) TABLET BY MOUTH EVERY DAY   doxycycline (VIBRA-TABS) 100 MG tablet Take 1 tablet (100 mg total) by mouth 2 (two) times daily. 1 po bid   gabapentin (NEURONTIN) 100 MG capsule TAKE ONE (1) CAPSULE BY MOUTH 2  TIMES DAILY   levothyroxine (SYNTHROID) 100 MCG tablet Take 1 tablet (100 mcg total) by mouth daily.   olmesartan-hydrochlorothiazide (BENICAR HCT) 40-25 MG tablet Take 1 tablet by mouth daily.   pantoprazole (PROTONIX) 40 MG tablet Take 1 tablet (40 mg total) by mouth daily.   traZODone (DESYREL) 100 MG tablet Take 1 tablet (100 mg total) by mouth at bedtime.   No facility-administered encounter medications on file as of 10/15/2023.    Past Surgical History:  Procedure Laterality Date   BACK SURGERY     HERNIA REPAIR     TUBAL LIGATION      Family History  Problem Relation Age of Onset   Heart disease Mother    Cancer Mother        lung      Controlled substance contract: n/a     Review of Systems  Constitutional:  Negative for diaphoresis.  Eyes:  Negative for pain.  Respiratory:  Negative for shortness of breath.   Cardiovascular:  Negative for chest pain, palpitations and leg swelling.  Gastrointestinal:  Negative for abdominal pain.  Endocrine: Negative for polydipsia.  Skin:  Negative for rash.  Neurological:  Negative for dizziness, weakness and headaches.  Hematological:  Does not bruise/bleed easily.  All other systems reviewed and are negative.      Objective:   Physical Exam Vitals and nursing note reviewed.  Constitutional:      General: She is not  in acute distress.    Appearance: Normal appearance. She is well-developed.  HENT:     Head: Normocephalic.     Right Ear: Tympanic membrane normal.     Left Ear: Tympanic membrane normal.     Nose: Nose normal.     Mouth/Throat:     Mouth: Mucous membranes are moist.  Eyes:     Pupils: Pupils are equal, round, and reactive to light.  Neck:     Vascular: No carotid bruit or JVD.  Cardiovascular:     Rate and Rhythm: Normal rate and regular rhythm.     Heart sounds: Normal heart sounds.  Pulmonary:     Effort: Pulmonary effort is normal. No respiratory distress.     Breath sounds: Normal breath  sounds. No wheezing (exp wheezes throughout) or rales.  Chest:     Chest wall: No tenderness.  Abdominal:     General: Bowel sounds are normal. There is no distension or abdominal bruit.     Palpations: Abdomen is soft. There is no hepatomegaly, splenomegaly, mass or pulsatile mass.     Tenderness: There is no abdominal tenderness.  Musculoskeletal:        General: Normal range of motion.     Cervical back: Normal range of motion and neck supple.  Lymphadenopathy:     Cervical: No cervical adenopathy.  Skin:    General: Skin is warm and dry.  Neurological:     Mental Status: She is alert and oriented to person, place, and time.     Deep Tendon Reflexes: Reflexes are normal and symmetric.  Psychiatric:        Behavior: Behavior normal.        Thought Content: Thought content normal.        Judgment: Judgment normal.    There were no vitals taken for this visit.        Assessment & Plan:  Lynn Golden comes in today with chief complaint of No chief complaint on file.   Diagnosis and orders addressed:  1. Primary hypertension Low sodium diet - lisinopril (ZESTRIL) 40 MG tablet; TAKE ONE (1) TABLET BY MOUTH EVERY DAY  Dispense: 90 tablet; Refill: 1 - CBC with Differential/Platelet - CMP14+EGFR - Lipid panel  2. Acquired hypothyroidism Labs pending - levothyroxine (SYNTHROID) 100 MCG tablet; Take 1 tablet (100 mcg total) by mouth daily.  Dispense: 90 tablet; Refill: 1 - Thyroid Panel With TSH  3. COPD (chronic obstructive pulmonary disease) with acute bronchitis (HCC) Smoking cessation encouraged - fluticasone-salmeterol (ADVAIR) 100-50 MCG/ACT AEPB; Inhale 1 puff into the lungs 2 (two) times daily.  Dispense: 180 each; Refill: 1  4. GERD without esophagitis Avoid spicy foods Do not eat 2 hours prior to bedtime  - pantoprazole (PROTONIX) 40 MG tablet; Take 1 tablet (40 mg total) by mouth daily.  Dispense: 90 tablet; Refill: 1  5. Recurrent major depressive  disorder, in full remission (HCC) Stress management Increased celexa to 40mg  daily - citalopram (CELEXA) 40 MG tablet; Take 1 tablet (40 mg total) by mouth daily.  Dispense: 90 tablet; Refill: 1  6. Primary insomnia Bedtime routine - traZODone (DESYREL) 100 MG tablet; Take 1 tablet (100 mg total) by mouth at bedtime.  Dispense: 90 tablet; Refill: 1  7. BMI 31.0-31.9,adult Discussed diet and exercise for person with BMI >25 Will recheck weight in 3-6 months   8. Body aches - gabapentin (NEURONTIN) 100 MG capsule; Take 1 capsule (100 mg total) by mouth 2 (two) times daily.  Dispense: 180 capsule; Refill: 0   Labs pending Health Maintenance reviewed Diet and exercise encouraged  Follow up plan: 6 months   Mary-Margaret Daphine Deutscher, FNP

## 2023-10-26 NOTE — Telephone Encounter (Signed)
 Just need to reschedule appt

## 2023-10-28 ENCOUNTER — Ambulatory Visit

## 2023-10-28 ENCOUNTER — Ambulatory Visit
Admission: RE | Admit: 2023-10-28 | Discharge: 2023-10-28 | Disposition: A | Discharge status: Home / Self Care | Source: Ambulatory Visit | Attending: Acute Care | Admitting: Acute Care

## 2023-10-28 DIAGNOSIS — Z87891 Personal history of nicotine dependence: Secondary | ICD-10-CM

## 2023-10-28 DIAGNOSIS — Z122 Encounter for screening for malignant neoplasm of respiratory organs: Secondary | ICD-10-CM

## 2023-10-28 DIAGNOSIS — F17210 Nicotine dependence, cigarettes, uncomplicated: Secondary | ICD-10-CM

## 2023-10-31 NOTE — Progress Notes (Deleted)
 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   While working at [employer] 2004 had reduction in airflow down to 70%    History of Present Illness  09/06/2023  Pulmonary/ 1st office eval/ Lynn Golden / [employer] [facility] Advair 100 bid /ACEi  - no pred x one year  Chief Complaint  Patient presents with   Consult    Shortness of Breathe and copd   Dyspnea:  one flight and stops at top/ flat surface does fine but freq wheezing  Cough: one hour of am cough / congestion > slt beige  Sleep: bed is flat/ 2 pillows s resp cc  SABA use: avg 2-3 x per day  02: none  LDSCT:  referred 09/06/2023  Rec Plan A = Automatic = Always=  Breztri Take 2 puffs first thing in am and then another 2 puffs about 12 hours later.  Work on inhaler technique:  Plan B = Backup (to supplement plan A, not to replace it) Only use your albuterol inhaler as a rescue medication Stop lisinopril  Start olmesartan 40-25  one daily   - Allergy screen 09/06/2023 >  Eos 0.2 /  alpha one AT phenotype MM/ level 136     - PFT's  11/02/23   FEV1 1.70 (57 % ) ratio 0.53  p 53 % improvement from saba p Breztri prior to study with DLCO  18.50 (82%)   and FV curve mildly concave  and ERV 16% @  at wt 179        11/05/2023  f/u ov/[place] office/Lynn Golden re: Copd GOLD 2/AB maint on ***  No chief complaint on file.   Dyspnea:  *** Cough: *** Sleeping: ***   resp cc  SABA use: *** 02: ***  Lung cancer screening: done 10/28/23 not read yet    No obvious day to day or daytime variability or assoc excess/ purulent sputum or mucus plugs or hemoptysis or cp or chest tightness, subjective wheeze or overt sinus or hb symptoms.    Also denies any obvious fluctuation of symptoms with weather or environmental changes or other aggravating or  alleviating factors except as outlined above   No unusual exposure hx or h/o childhood pna/ asthma or knowledge of premature birth.  Current Allergies, Complete Past Medical History, Past Surgical History, Family History, and Social History were reviewed in [employer] record.  ROS  The following are not active complaints unless bolded Hoarseness, sore throat, dysphagia, dental problems, itching, sneezing,  nasal congestion or discharge of excess mucus or purulent secretions, ear ache,   fever, chills, sweats, unintended wt loss or wt gain, classically pleuritic or exertional cp,  orthopnea pnd or arm/hand swelling  or leg swelling, presyncope, palpitations, abdominal pain, anorexia, nausea, vomiting, diarrhea  or change in bowel habits or change in bladder habits, change in stools or change in urine, dysuria, hematuria,  rash, arthralgias, visual complaints, headache, numbness, weakness or ataxia or problems with walking or coordination,  change in mood or  memory.        No outpatient medications have been marked as taking for the 11/05/23 encounter (Appointment) with Nyoka Cowden, MD.            Past Medical History:  Diagnosis Date  Cancer (HCC)    Melanoma   COPD (chronic obstructive pulmonary disease) (HCC)    Generalized anxiety disorder       Objective:    Wt Readings from Last 3 Encounters:  10/11/23 172 lb (78 kg)  09/06/23 176 lb 9.6 oz (80.1 kg)  02/18/23 188 lb (85.3 kg)      Vital signs reviewed  11/05/2023  - Note at rest 02 sats  ***% on ***   General appearance:    ***     Mild barr ***    Assessment

## 2023-11-02 ENCOUNTER — Ambulatory Visit (HOSPITAL_COMMUNITY)
Admission: RE | Admit: 2023-11-02 | Discharge: 2023-11-02 | Disposition: A | Discharge status: Home / Self Care | Source: Ambulatory Visit | Attending: Internal Medicine | Admitting: Internal Medicine

## 2023-11-02 DIAGNOSIS — F17210 Nicotine dependence, cigarettes, uncomplicated: Secondary | ICD-10-CM | POA: Insufficient documentation

## 2023-11-02 DIAGNOSIS — J4489 Other specified chronic obstructive pulmonary disease: Secondary | ICD-10-CM | POA: Insufficient documentation

## 2023-11-02 LAB — PULMONARY FUNCTION TEST
DL/VA % pred: 102 %
DL/VA: 4.31 ml/min/mmHg/L
DLCO unc % pred: 82 %
DLCO unc: 18.5 ml/min/mmHg
FEF 25-75 Post: 1.45 L/s
FEF 25-75 Pre: 0.6 L/s
FEF2575-%Change-Post: 142 %
FEF2575-%Pred-Post: 51 %
FEF2575-%Pred-Pre: 21 %
FEV1-%Change-Post: 53 %
FEV1-%Pred-Post: 57 %
FEV1-%Pred-Pre: 37 %
FEV1-Post: 1.7 L
FEV1-Pre: 1.11 L
FEV1FVC-%Change-Post: 6 %
FEV1FVC-%Pred-Pre: 62 %
FEV6-%Change-Post: 36 %
FEV6-%Pred-Post: 80 %
FEV6-%Pred-Pre: 59 %
FEV6-Post: 2.96 L
FEV6-Pre: 2.16 L
FEV6FVC-%Change-Post: -4 %
FEV6FVC-%Pred-Post: 94 %
FEV6FVC-%Pred-Pre: 99 %
FVC-%Change-Post: 43 %
FVC-%Pred-Post: 84 %
FVC-%Pred-Pre: 59 %
FVC-Post: 3.2 L
FVC-Pre: 2.23 L
Post FEV1/FVC ratio: 53 %
Post FEV6/FVC ratio: 92 %
Pre FEV1/FVC ratio: 50 %
Pre FEV6/FVC Ratio: 97 %
RV % pred: 250 %
RV: 4.77 L
TLC % pred: 139 %
TLC: 7.49 L

## 2023-11-02 MED ORDER — ALBUTEROL SULFATE (2.5 MG/3ML) 0.083% IN NEBU
2.5000 mg | INHALATION_SOLUTION | Freq: Once | RESPIRATORY_TRACT | Status: AC
  Administered 2023-11-02: 2.5 mg via RESPIRATORY_TRACT

## 2023-11-05 ENCOUNTER — Ambulatory Visit: Admitting: Internal Medicine

## 2023-11-05 ENCOUNTER — Encounter: Payer: Self-pay | Admitting: Internal Medicine

## 2023-11-08 ENCOUNTER — Encounter: Payer: Self-pay | Admitting: Internal Medicine

## 2023-11-08 ENCOUNTER — Ambulatory Visit: Admitting: Nurse Practitioner

## 2023-11-08 ENCOUNTER — Encounter: Payer: Self-pay | Admitting: Nurse Practitioner

## 2023-11-08 VITALS — BP 117/83 | HR 90 | Temp 96.7°F | Ht 66.0 in | Wt 173.0 lb

## 2023-11-08 DIAGNOSIS — Z0001 Encounter for general adult medical examination with abnormal findings: Secondary | ICD-10-CM | POA: Diagnosis not present

## 2023-11-08 DIAGNOSIS — K219 Gastro-esophageal reflux disease without esophagitis: Secondary | ICD-10-CM

## 2023-11-08 DIAGNOSIS — J209 Acute bronchitis, unspecified: Secondary | ICD-10-CM

## 2023-11-08 DIAGNOSIS — R52 Pain, unspecified: Secondary | ICD-10-CM | POA: Diagnosis not present

## 2023-11-08 DIAGNOSIS — F17210 Nicotine dependence, cigarettes, uncomplicated: Secondary | ICD-10-CM

## 2023-11-08 DIAGNOSIS — Z6827 Body mass index (BMI) 27.0-27.9, adult: Secondary | ICD-10-CM

## 2023-11-08 DIAGNOSIS — E039 Hypothyroidism, unspecified: Secondary | ICD-10-CM | POA: Diagnosis not present

## 2023-11-08 DIAGNOSIS — Z0000 Encounter for general adult medical examination without abnormal findings: Secondary | ICD-10-CM

## 2023-11-08 DIAGNOSIS — I10 Essential (primary) hypertension: Secondary | ICD-10-CM

## 2023-11-08 DIAGNOSIS — F5101 Primary insomnia: Secondary | ICD-10-CM

## 2023-11-08 DIAGNOSIS — F3342 Major depressive disorder, recurrent, in full remission: Secondary | ICD-10-CM

## 2023-11-08 DIAGNOSIS — J440 Chronic obstructive pulmonary disease with acute lower respiratory infection: Secondary | ICD-10-CM

## 2023-11-08 LAB — LIPID PANEL

## 2023-11-08 MED ORDER — CITALOPRAM HYDROBROMIDE 40 MG PO TABS: 40.0000 mg | ORAL_TABLET | Freq: Every day | ORAL | 1 refills | Status: DC

## 2023-11-08 MED ORDER — OLMESARTAN MEDOXOMIL-HCTZ 40-25 MG PO TABS: 1.0000 | ORAL_TABLET | Freq: Every day | ORAL | 1 refills | Status: DC

## 2023-11-08 MED ORDER — ALBUTEROL SULFATE HFA 108 (90 BASE) MCG/ACT IN AERS: 2.0000 | INHALATION_SPRAY | Freq: Four times a day (QID) | RESPIRATORY_TRACT | 2 refills | Status: AC | PRN

## 2023-11-08 MED ORDER — TRAZODONE HCL 100 MG PO TABS: 100.0000 mg | ORAL_TABLET | Freq: Every day | ORAL | 1 refills | Status: DC

## 2023-11-08 MED ORDER — PANTOPRAZOLE SODIUM 40 MG PO TBEC: 40.0000 mg | DELAYED_RELEASE_TABLET | Freq: Every day | ORAL | 1 refills | Status: DC

## 2023-11-08 MED ORDER — GABAPENTIN 100 MG PO CAPS
100.0000 mg | ORAL_CAPSULE | Freq: Three times a day (TID) | ORAL | 1 refills | Status: DC
Start: 2023-11-08 — End: 2024-03-28

## 2023-11-08 MED ORDER — LEVOTHYROXINE SODIUM 100 MCG PO TABS: 100.0000 ug | ORAL_TABLET | Freq: Every day | ORAL | 1 refills | Status: DC

## 2023-11-08 NOTE — Patient Instructions (Signed)

## 2023-11-08 NOTE — Progress Notes (Signed)
 +  Subjective:    Patient ID: Lynn Golden, female    DOB: July 12, 1971, 53 y.o.   MRN: [id number]   Chief Complaint: annual physical exam   HPI:  Lynn Golden is a 53 y.o. who identifies as a female who was assigned female at birth.   Social history: Lives with: husband Work history: works for [employer] in today for follow up of the following chronic medical issues:  1. Annual physical exam No pap today  2. Primary hypertension No c/o chest pain, sob or headache. Checks blood pressure occasionally at work BP Readings from Last 3 Encounters:  10/11/23 110/71  09/06/23 (!) 178/109  02/18/23 133/86     3. COPD (chronic obstructive pulmonary disease) with acute bronchitis (HCC) Smoker. Suppoose to use adviar daily. Has not needed albuterol laately  4. GERD without esophagitis Is on protonix which works well for her  5. Acquired hypothyroidism No issues that she is aware of. Lab Results  Component Value Date   TSH 2.140 02/18/2023     6. Recurrent major depressive disorder, in full remission (HCC) Has been o celexa for awhile now. Says she is doing well.    10/11/2023    8:52 AM 02/18/2023    1:51 PM 08/18/2022    1:55 PM  Depression screen PHQ 2/9  Decreased Interest 1 2 1   Down, Depressed, Hopeless 1 2 1   PHQ - 2 Score 2 4 2   Altered sleeping 2 2 3   Tired, decreased energy 2 3 1   Change in appetite 2 2 0  Feeling bad or failure about yourself  1 2 1   Trouble concentrating 1 3 2   Moving slowly or fidgety/restless 0 2 1  Suicidal thoughts 0 1 0  PHQ-9 Score 10 19 10   Difficult doing work/chores Somewhat difficult Very difficult Somewhat difficult      7. Primary insomnia Takes trazadone to sleep. Sleeps about 6 hours a night. Trazadone not working as well as it was.  8. hyperlipidemia Tries to watch diet but does no dedicated exercise Lab Results  Component Value Date   CHOL 206 (H) 02/18/2023   HDL 79 02/18/2023   LDLCALC 115 (H)  02/18/2023   TRIG 69 02/18/2023   CHOLHDL 2.6 02/18/2023   9. Peripheral edema Is on  lasix. Works well.  10. Smoker Had low dose CT scan 10/22/23 which was normal   11. BMI 27.0-27.9,adult No recent weight changes Wt Readings from Last 3 Encounters:  10/11/23 172 lb (78 kg)  09/06/23 176 lb 9.6 oz (80.1 kg)  02/18/23 188 lb (85.3 kg)   BMI Readings from Last 3 Encounters:  10/11/23 27.76 kg/m  09/06/23 28.50 kg/m  02/18/23 30.34 kg/m     New complaints: None today  Allergies  Allergen Reactions   Tylenol With Codeine #3 [Acetaminophen-Codeine]    Outpatient Encounter Medications as of 11/08/2023  Medication Sig   albuterol (VENTOLIN HFA) 108 (90 Base) MCG/ACT inhaler USE 2 PUFFS EVERY 6 HOURS AS NEEDED   Budeson-Glycopyrrol-Formoterol (BREZTRI AEROSPHERE) 160-9-4.8 MCG/ACT AERO Take 2 puffs first thing in am and then another 2 puffs about 12 hours later.   citalopram (CELEXA) 40 MG tablet TAKE ONE (1) TABLET BY MOUTH EVERY DAY   doxycycline (VIBRA-TABS) 100 MG tablet Take 1 tablet (100 mg total) by mouth 2 (two) times daily. 1 po bid   gabapentin (NEURONTIN) 100 MG capsule TAKE ONE (1) CAPSULE BY MOUTH 2 TIMES DAILY   levothyroxine (SYNTHROID) 100 MCG tablet  Take 1 tablet (100 mcg total) by mouth daily.   olmesartan-hydrochlorothiazide (BENICAR HCT) 40-25 MG tablet Take 1 tablet by mouth daily.   pantoprazole (PROTONIX) 40 MG tablet Take 1 tablet (40 mg total) by mouth daily.   traZODone (DESYREL) 100 MG tablet Take 1 tablet (100 mg total) by mouth at bedtime.   No facility-administered encounter medications on file as of 11/08/2023.    Past Surgical History:  Procedure Laterality Date   BACK SURGERY     HERNIA REPAIR     TUBAL LIGATION      Family History  Problem Relation Age of Onset   Heart disease Mother    Cancer Mother        lung      Controlled substance contract: n/a     Review of Systems  Constitutional:  Negative for diaphoresis.  Eyes:   Negative for pain.  Respiratory:  Negative for shortness of breath.   Cardiovascular:  Negative for chest pain, palpitations and leg swelling.  Gastrointestinal:  Negative for abdominal pain.  Endocrine: Negative for polydipsia.  Skin:  Negative for rash.  Neurological:  Negative for dizziness, weakness and headaches.  Hematological:  Does not bruise/bleed easily.  All other systems reviewed and are negative.      Objective:   Physical Exam Vitals and nursing note reviewed.  Constitutional:      General: She is not in acute distress.    Appearance: Normal appearance. She is well-developed.  HENT:     Head: Normocephalic.     Right Ear: Tympanic membrane normal.     Left Ear: Tympanic membrane normal.     Nose: Nose normal.     Mouth/Throat:     Mouth: Mucous membranes are moist.  Eyes:     Pupils: Pupils are equal, round, and reactive to light.  Neck:     Vascular: No carotid bruit or JVD.  Cardiovascular:     Rate and Rhythm: Normal rate and regular rhythm.     Heart sounds: Normal heart sounds.  Pulmonary:     Effort: Pulmonary effort is normal. No respiratory distress.     Breath sounds: Normal breath sounds. No wheezing or rales.  Chest:     Chest wall: No tenderness.  Abdominal:     General: Bowel sounds are normal. There is no distension or abdominal bruit.     Palpations: Abdomen is soft. There is no hepatomegaly, splenomegaly, mass or pulsatile mass.     Tenderness: There is no abdominal tenderness.  Musculoskeletal:        General: Normal range of motion.     Cervical back: Normal range of motion and neck supple.  Lymphadenopathy:     Cervical: No cervical adenopathy.  Skin:    General: Skin is warm and dry.  Neurological:     Mental Status: She is alert and oriented to person, place, and time.     Deep Tendon Reflexes: Reflexes are normal and symmetric.  Psychiatric:        Behavior: Behavior normal.        Thought Content: Thought content normal.         Judgment: Judgment normal.    BP 117/83   Pulse 90   Temp (!) 96.7 F (35.9 C) (Temporal)   Ht 5\' 6"  (1.676 m)   Wt 173 lb (78.5 kg)   SpO2 93%   BMI 27.92 kg/m    EKG- NSR-Mary-Margaret Daphine Deutscher, FNP  chest xray- pending radiologist-Preliminary reading by  Paulene Floor, FNP  [facility]       Assessment & Plan:   Lynn Golden comes in today with chief complaint of annual physical  Diagnosis and orders addressed:  1. Annual physical exam - CBC with Differential/Platelet - VITAMIN D 25 Hydroxy (Vit-D Deficiency, Fractures)  2. Primary hypertension Low sodium diet - CMP14+EGFR - Lipid panel - lisinopril (ZESTRIL) 40 MG tablet; TAKE ONE (1) TABLET BY MOUTH EVERY DAY  Dispense: 90 tablet; Refill: 1 - DG Chest 2 View - EKG 12-Lead  3. COPD (chronic obstructive pulmonary disease) with acute bronchitis (HCC) Continue adviar Smoking cessation encouraged - ADVAIR DISKUS 100-50 MCG/ACT AEPB; Inhale 1 puff into the lungs 2 (two) times daily.  Dispense: 180 each; Refill: 1  4. GERD without esophagitis Avoid spicy foods Do not eat 2 hours prior to bedtime  - pantoprazole (PROTONIX) 40 MG tablet; Take 1 tablet (40 mg total) by mouth daily.  Dispense: 90 tablet; Refill: 1  5. Acquired hypothyroidism Labs pending - Thyroid Panel With TSH - levothyroxine (SYNTHROID) 88 MCG tablet; TAKE ONE (1) TABLET BY MOUTH EVERY DAY  Dispense: 90 tablet; Refill: 0  6. Recurrent major depressive disorder, in full remission (HCC) Stress management - citalopram (CELEXA) 20 MG tablet; TAKE ONE (1) TABLET BY MOUTH EVERY DAY  Dispense: 90 tablet; Refill: 0  7. Primary insomnia Bedtime routine - traZODone (DESYREL) 100 MG tablet; Take 1 tablet (100 mg total) by mouth at bedtime.  Dispense: 90 tablet; Refill: 0  8. BMI 32.0-32.9,adult Discussed diet and exercise for person with BMI >25 Will recheck weight in 3-6 months   9. Body aches Wants t try neurontin - gabapentin (NEURONTIN) 100 MG  capsule; Take 1 capsule (100 mg total) by mouth 2 (two) times daily.  Dispense: 180 capsule; Refill: 1  10. Mixed hyperlipidemia Low fat diet - rosuvastatin (CRESTOR) 10 MG tablet; Take 1 tablet (10 mg total) by mouth daily.  Dispense: 90 tablet; Refill: 1  11. Peripheral edema Elevate legs hen sitting Compression socks - furosemide (LASIX) 20 MG tablet; Take 1 tablet (20 mg total) by mouth daily.  Dispense: 90 tablet; Refill: 1   Cologuard ordered Labs pending Health Maintenance reviewed Diet and exercise encouraged  Follow up plan: 6 months   Mary-Margaret Daphine Deutscher, FNP

## 2023-11-09 LAB — CBC WITH DIFFERENTIAL/PLATELET
Basophils Absolute: 0.1 10*3/uL (ref 0.0–0.2)
Basos: 1 %
EOS (ABSOLUTE): 0.2 10*3/uL (ref 0.0–0.4)
Eos: 3 %
Hematocrit: 46.3 % (ref 34.0–46.6)
Hemoglobin: 15.8 g/dL (ref 11.1–15.9)
Immature Grans (Abs): 0 10*3/uL (ref 0.0–0.1)
Immature Granulocytes: 0 %
Lymphocytes Absolute: 1.8 10*3/uL (ref 0.7–3.1)
Lymphs: 31 %
MCH: 32.7 pg (ref 26.6–33.0)
MCHC: 34.1 g/dL (ref 31.5–35.7)
MCV: 96 fL (ref 79–97)
Monocytes Absolute: 0.5 10*3/uL (ref 0.1–0.9)
Monocytes: 8 %
Neutrophils Absolute: 3.3 10*3/uL (ref 1.4–7.0)
Neutrophils: 57 %
Platelets: 331 10*3/uL (ref 150–450)
RBC: 4.83 x10E6/uL (ref 3.77–5.28)
RDW: 12.1 % (ref 11.7–15.4)
WBC: 5.8 10*3/uL (ref 3.4–10.8)

## 2023-11-09 LAB — CMP14+EGFR
ALT: 13 IU/L (ref 0–32)
AST: 16 IU/L (ref 0–40)
Albumin: 4.3 g/dL (ref 3.8–4.9)
Alkaline Phosphatase: 93 IU/L (ref 44–121)
BUN/Creatinine Ratio: 13 (ref 9–23)
BUN: 11 mg/dL (ref 6–24)
Bilirubin Total: 0.3 mg/dL (ref 0.0–1.2)
CO2: 23 mmol/L (ref 20–29)
Calcium: 9.6 mg/dL (ref 8.7–10.2)
Chloride: 95 mmol/L — ABNORMAL LOW (ref 96–106)
Creatinine, Ser: 0.88 mg/dL (ref 0.57–1.00)
Globulin, Total: 2.3 g/dL (ref 1.5–4.5)
Glucose: 84 mg/dL (ref 70–99)
Potassium: 4.6 mmol/L (ref 3.5–5.2)
Sodium: 134 mmol/L (ref 134–144)
Total Protein: 6.6 g/dL (ref 6.0–8.5)
eGFR: 80 mL/min/{1.73_m2} (ref >59)

## 2023-11-09 LAB — LIPID PANEL
Cholesterol, Total: 233 mg/dL — ABNORMAL HIGH (ref 100–199)
HDL: 84 mg/dL (ref >39)
LDL CALC COMMENT:: 2.8 ratio (ref 0.0–4.4)
LDL Chol Calc (NIH): 125 mg/dL — ABNORMAL HIGH (ref 0–99)
Triglycerides: 142 mg/dL (ref 0–149)
VLDL Cholesterol Cal: 24 mg/dL (ref 5–40)

## 2023-11-09 LAB — THYROID PANEL WITH TSH
Free Thyroxine Index: 2 (ref 1.2–4.9)
T3 Uptake Ratio: 28 % (ref 24–39)
T4, Total: 7.3 ug/dL (ref 4.5–12.0)
TSH: 2.87 u[IU]/mL (ref 0.450–4.500)

## 2023-11-22 ENCOUNTER — Telehealth: Payer: Self-pay | Admitting: Internal Medicine

## 2023-11-22 NOTE — Telephone Encounter (Signed)
 This message came in as an appointment request----Need to speak about PFT and lung scan. Also, have to admit it but my breathing so bad at times now I would like to speak with Dr wert about breathing TX, thank you   [phone]

## 2023-11-23 NOTE — Telephone Encounter (Signed)
Attempted to contact patient. VM full.

## 2023-11-24 NOTE — Progress Notes (Unsigned)
 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   While working at [employer] 2004 had reduction in airflow down to 70%    History of Present Illness  09/06/2023  Pulmonary/ 1st office eval/ Lynn Golden / [employer] [facility] Advair  100 bid /ACEi  - no pred x one year  Chief Complaint  Patient presents with   Consult    Shortness of Breathe and copd   Dyspnea:  one flight and stops at top/ flat surface does fine but freq wheezing  Cough: one hour of am cough / congestion > slt beige  Sleep: bed is flat/ 2 pillows s resp cc  SABA use: avg 2-3 x per day  02: none  LDSCT:  referred 09/06/2023  Rec Plan A = Automatic = Always=  Breztri  Take 2 puffs first thing in am and then another 2 puffs about 12 hours later.  Work on inhaler technique:  Plan Golden = Backup (to supplement plan A, not to replace it) Only use your albuterol  inhaler as a rescue medication Stop lisinopril   Start olmesartan  40-25  one daily   - Allergy screen 09/06/2023 >  Eos 0.2 /  alpha one AT phenotype MM/ level 136    - PFT's  11/02/23   FEV1 1.70 (57 % ) ratio 0.53  p 53 % improvement from saba p Breztri  prior to study with DLCO  18.50 (82%)   and FV curve mildly concave  and ERV 16% @  at wt 179        11/25/2023  f/u ov/ office/Lynn Golden re: Copd GOLD 2/AB maint on ***  No chief complaint on file.   Dyspnea:  *** Cough: *** Sleeping: ***   resp cc  SABA use: *** 02: ***  Lung cancer screening: done 10/28/23 not read yet    No obvious day to day or daytime variability or assoc excess/ purulent sputum or mucus plugs or hemoptysis or cp or chest tightness, subjective wheeze or overt sinus or hb symptoms.    Also denies any obvious fluctuation of symptoms with weather or environmental changes or other aggravating or  alleviating factors except as outlined above   No unusual exposure hx or h/o childhood pna/ asthma or knowledge of premature birth.  Current Allergies, Complete Past Medical History, Past Surgical History, Family History, and Social History were reviewed in [employer] record.  ROS  The following are not active complaints unless bolded Hoarseness, sore throat, dysphagia, dental problems, itching, sneezing,  nasal congestion or discharge of excess mucus or purulent secretions, ear ache,   fever, chills, sweats, unintended wt loss or wt gain, classically pleuritic or exertional cp,  orthopnea pnd or arm/hand swelling  or leg swelling, presyncope, palpitations, abdominal pain, anorexia, nausea, vomiting, diarrhea  or change in bowel habits or change in bladder habits, change in stools or change in urine, dysuria, hematuria,  rash, arthralgias, visual complaints, headache, numbness, weakness or ataxia or problems with walking or coordination,  change in mood or  memory.        No outpatient medications have been marked as taking for the 11/25/23 encounter (Appointment) with Lynn Loya B, MD.            Past Medical History:  Diagnosis Date  Cancer (HCC)    Melanoma   COPD (chronic obstructive pulmonary disease) (HCC)    Generalized anxiety disorder       Objective:    Wt Readings from Last 3 Encounters:  11/08/23 173 lb (78.5 kg)  10/11/23 172 lb (78 kg)  09/06/23 176 lb 9.6 oz (80.1 kg)      Vital signs reviewed  11/25/2023  - Note at rest 02 sats  ***% on ***   General appearance:    ***     Mild barr ***    Assessment

## 2023-11-25 ENCOUNTER — Encounter: Payer: Self-pay | Admitting: Internal Medicine

## 2023-11-25 ENCOUNTER — Ambulatory Visit: Admitting: Internal Medicine

## 2023-11-25 VITALS — BP 130/86 | HR 98 | Ht 66.0 in | Wt 172.4 lb

## 2023-11-25 DIAGNOSIS — J449 Chronic obstructive pulmonary disease, unspecified: Secondary | ICD-10-CM

## 2023-11-25 DIAGNOSIS — F17210 Nicotine dependence, cigarettes, uncomplicated: Secondary | ICD-10-CM | POA: Diagnosis not present

## 2023-11-25 DIAGNOSIS — I10 Essential (primary) hypertension: Secondary | ICD-10-CM | POA: Diagnosis not present

## 2023-11-25 NOTE — Assessment & Plan Note (Signed)
 Change ACE to ARB 09/06/2023 due to cough / pseudowheeze > improved 11/25/2023 but pseudowheeze not resolved  - 11/25/2023 rec gerd rx/ ent f/u prn   Although even in retrospect it may not be clear the ACEi contributed to the pt's symptoms,  Pt improved off them and adding them back at this point or in the future would risk confusion in interpretation of non-specific respiratory symptoms to which this patient is prone  ie  Better not to muddy the waters here.   >>>  no charge rx/ f/u PCP on benicar  40-12.5 one daily

## 2023-11-25 NOTE — Assessment & Plan Note (Signed)
 Active smoker/MM  - Allergy screen 09/06/2023 >  Eos 0.2 /  alpha one AT phenotype MM/ level 136   - 09/06/2023  After extensive coaching inhaler device,  effectiveness =    80% hfa >  breztri  plus d/c ACEi - PFT's  11/02/23   FEV1 1.70 (57 % ) ratio 0.53  p 53 % improvement from saba p Breztri  prior to study with DLCO  18.50 (82%)   and FV curve mildly concave/ truncated insp loop bu no true plateau   and ERV 16% @  at wt 179      Mild/moderate copd /AB c/w  Group D (now reclassified as E) in terms of symptom/risk and laba/lama/ICS  therefore appropriate rx at this point >>>  continue breztri  and approp saba

## 2023-11-25 NOTE — Telephone Encounter (Signed)
 Patient seen in office today.

## 2023-11-25 NOTE — Patient Instructions (Signed)
 No change in medications   The key is to stop smoking completely before smoking completely stops you!  Call for ENT referral if noisy breathing not better by end of summer   Please schedule a follow up visit in 12  months but call sooner if needed

## 2023-11-25 NOTE — Assessment & Plan Note (Signed)
 Counseled re importance of smoking cessation but did not meet time criteria for separate billing    Low-dose CT lung cancer screening is recommended for patients who are 14-52 years of age with a 20+ pack-year history of smoking and who are currently smoking or quit <=15 years ago. No coughing up blood  No unintentional weight loss of > 15 pounds in the last 6 months - pt is eligible for scanning yearly until 8 y p quits > last done 10/28/23 with mild emphysema/ reviewed with pt 11/25/2023 > follow the fleischner society guidelines if nodules uncovered.  F/u here yearly          Each maintenance medication was reviewed in detail including emphasizing most importantly the difference between maintenance and prns and under what circumstances the prns are to be triggered using an action plan format where appropriate.  Total time for H and P, chart review, counseling, reviewing hfa  device(s) and generating customized AVS unique to this office visit / same day charting = 30 min

## 2023-12-03 ENCOUNTER — Other Ambulatory Visit: Payer: Self-pay

## 2023-12-03 DIAGNOSIS — Z122 Encounter for screening for malignant neoplasm of respiratory organs: Secondary | ICD-10-CM

## 2023-12-03 DIAGNOSIS — F17210 Nicotine dependence, cigarettes, uncomplicated: Secondary | ICD-10-CM

## 2023-12-03 DIAGNOSIS — Z87891 Personal history of nicotine dependence: Secondary | ICD-10-CM

## 2024-02-08 ENCOUNTER — Other Ambulatory Visit: Payer: Self-pay | Admitting: Nurse Practitioner

## 2024-02-08 DIAGNOSIS — E039 Hypothyroidism, unspecified: Secondary | ICD-10-CM

## 2024-03-28 ENCOUNTER — Other Ambulatory Visit: Payer: Self-pay | Admitting: Nurse Practitioner

## 2024-03-28 DIAGNOSIS — F5101 Primary insomnia: Secondary | ICD-10-CM

## 2024-03-28 DIAGNOSIS — R52 Pain, unspecified: Secondary | ICD-10-CM

## 2024-05-18 ENCOUNTER — Encounter: Payer: Self-pay | Admitting: Nurse Practitioner

## 2024-05-18 ENCOUNTER — Ambulatory Visit: Admitting: Nurse Practitioner

## 2024-05-18 VITALS — BP 103/72 | HR 92 | Temp 97.6°F | Ht 66.0 in | Wt 171.0 lb

## 2024-05-18 DIAGNOSIS — F17210 Nicotine dependence, cigarettes, uncomplicated: Secondary | ICD-10-CM

## 2024-05-18 DIAGNOSIS — E039 Hypothyroidism, unspecified: Secondary | ICD-10-CM | POA: Diagnosis not present

## 2024-05-18 DIAGNOSIS — J449 Chronic obstructive pulmonary disease, unspecified: Secondary | ICD-10-CM

## 2024-05-18 DIAGNOSIS — Z0000 Encounter for general adult medical examination without abnormal findings: Secondary | ICD-10-CM

## 2024-05-18 DIAGNOSIS — R52 Pain, unspecified: Secondary | ICD-10-CM

## 2024-05-18 DIAGNOSIS — Z0001 Encounter for general adult medical examination with abnormal findings: Secondary | ICD-10-CM

## 2024-05-18 DIAGNOSIS — F3342 Major depressive disorder, recurrent, in full remission: Secondary | ICD-10-CM

## 2024-05-18 DIAGNOSIS — I10 Essential (primary) hypertension: Secondary | ICD-10-CM | POA: Diagnosis not present

## 2024-05-18 DIAGNOSIS — J209 Acute bronchitis, unspecified: Secondary | ICD-10-CM

## 2024-05-18 DIAGNOSIS — J440 Chronic obstructive pulmonary disease with acute lower respiratory infection: Secondary | ICD-10-CM | POA: Diagnosis not present

## 2024-05-18 DIAGNOSIS — F5101 Primary insomnia: Secondary | ICD-10-CM

## 2024-05-18 DIAGNOSIS — K219 Gastro-esophageal reflux disease without esophagitis: Secondary | ICD-10-CM

## 2024-05-18 MED ORDER — GABAPENTIN 100 MG PO CAPS: 100.0000 mg | ORAL_CAPSULE | Freq: Two times a day (BID) | ORAL | 1 refills | Status: AC

## 2024-05-18 MED ORDER — PANTOPRAZOLE SODIUM 40 MG PO TBEC: 40.0000 mg | DELAYED_RELEASE_TABLET | Freq: Every day | ORAL | 1 refills | Status: AC

## 2024-05-18 MED ORDER — TRAZODONE HCL 100 MG PO TABS: 100.0000 mg | ORAL_TABLET | Freq: Every day | ORAL | 1 refills | Status: AC

## 2024-05-18 MED ORDER — CITALOPRAM HYDROBROMIDE 40 MG PO TABS: 40.0000 mg | ORAL_TABLET | Freq: Every day | ORAL | 1 refills | Status: DC

## 2024-05-18 MED ORDER — OLMESARTAN MEDOXOMIL-HCTZ 40-25 MG PO TABS: 1.0000 | ORAL_TABLET | Freq: Every day | ORAL | 1 refills | Status: AC

## 2024-05-18 NOTE — Patient Instructions (Signed)

## 2024-05-18 NOTE — Progress Notes (Signed)
 Subjective:    Patient ID: Lynn Golden, female    DOB: 07-26-1971, 53 y.o.   MRN: [id number]   Chief Complaint: annual physical   HPI:  Lynn Golden is a 53 y.o. who identifies as a female who was assigned female at birth.   Social history: Lives with: lives by herself now Work history: [employer]   Comes in today for follow up of the following chronic medical issues:  1. Primary hypertension No c/o chest pain, sob or headache. Does not check blood pressure at home. BP Readings from Last 3 Encounters:  11/25/23 130/86  11/08/23 117/83  10/11/23 110/71     2. Acquired hypothyroidism No issues that she is aware of. No change was made to meds at last appointment. Lab Results  Component Value Date   TSH 2.870 11/08/2023     3. COPD (chronic obstructive pulmonary disease) Is on advair  daily and uses albuterol  several times a month. She is still smoking. Is now seeing pulmonoloy every 6 months  4.  Cigarette smoker Smokes less then half a pack a day. Low dose CT scan was clear.  5. GERD without esophagitis Is on protonix  daily  6. Recurrent major depressive disorder, in full remission (HCC) Is on celexa  and is doing well.     05/18/2024    1:41 PM 11/08/2023    4:12 PM 10/11/2023    8:52 AM  Depression screen PHQ 2/9  Decreased Interest 1 1 1   Down, Depressed, Hopeless 1 1 1   PHQ - 2 Score 2 2 2   Altered sleeping 1 1 2   Tired, decreased energy 2 2 2   Change in appetite 1 2 2   Feeling bad or failure about yourself  1 1 1   Trouble concentrating 2 2 1   Moving slowly or fidgety/restless 1 1 0  Suicidal thoughts 0 0 0  PHQ-9 Score 10 11 10   Difficult doing work/chores Somewhat difficult Not difficult at all Somewhat difficult    7. Primary insomnia Is on trazadone to sleep. Sleeps about 8-8 hours  8. BMI 31.0-31.9,adult No recent weight changes Wt Readings from Last 3 Encounters:  05/18/24 171 lb (77.6 kg)  11/25/23 172 lb 6 oz (78.2 kg)  11/08/23  173 lb (78.5 kg)   BMI Readings from Last 3 Encounters:  05/18/24 27.60 kg/m  11/25/23 27.82 kg/m  11/08/23 27.92 kg/m     New complaints: None  today  Allergies  Allergen Reactions   Tylenol With Codeine #3 [Acetaminophen-Codeine]    Outpatient Encounter Medications as of 05/18/2024  Medication Sig   albuterol  (VENTOLIN  HFA) 108 (90 Base) MCG/ACT inhaler Inhale 2 puffs into the lungs every 6 (six) hours as needed for wheezing or shortness of breath.   Budeson-Glycopyrrol-Formoterol  (BREZTRI  AEROSPHERE) 160-9-4.8 MCG/ACT AERO Take 2 puffs first thing in am and then another 2 puffs about 12 hours later.   citalopram  (CELEXA ) 40 MG tablet Take 1 tablet (40 mg total) by mouth daily.   gabapentin  (NEURONTIN ) 100 MG capsule TAKE ONE (1) CAPSULE BY MOUTH 2 TIMES DAILY   levothyroxine  (SYNTHROID ) 100 MCG tablet TAKE ONE (1) TABLET BY MOUTH EVERY DAY   olmesartan -hydrochlorothiazide (BENICAR  HCT) 40-25 MG tablet Take 1 tablet by mouth daily.   pantoprazole  (PROTONIX ) 40 MG tablet Take 1 tablet (40 mg total) by mouth daily.   traZODone  (DESYREL ) 100 MG tablet TAKE ONE TABLET BY MOUTH AT BEDTIME   No facility-administered encounter medications on file as of 05/18/2024.    Past Surgical History:  Procedure Laterality Date   BACK SURGERY     HERNIA REPAIR     TUBAL LIGATION      Family History  Problem Relation Age of Onset   Heart disease Mother    Cancer Mother        lung      Controlled substance contract: n/a     Review of Systems  Constitutional:  Negative for diaphoresis.  Eyes:  Negative for pain.  Respiratory:  Negative for shortness of breath.   Cardiovascular:  Negative for chest pain, palpitations and leg swelling.  Gastrointestinal:  Negative for abdominal pain.  Endocrine: Negative for polydipsia.  Skin:  Negative for rash.  Neurological:  Negative for dizziness, weakness and headaches.  Hematological:  Does not bruise/bleed easily.  All other systems  reviewed and are negative.      Objective:   Physical Exam Vitals and nursing note reviewed.  Constitutional:      General: She is not in acute distress.    Appearance: Normal appearance. She is well-developed.  HENT:     Head: Normocephalic.     Right Ear: Tympanic membrane normal.     Left Ear: Tympanic membrane normal.     Nose: Nose normal.     Mouth/Throat:     Mouth: Mucous membranes are moist.  Eyes:     Pupils: Pupils are equal, round, and reactive to light.  Neck:     Vascular: No carotid bruit or JVD.  Cardiovascular:     Rate and Rhythm: Normal rate and regular rhythm.     Heart sounds: Normal heart sounds.  Pulmonary:     Effort: Pulmonary effort is normal. No respiratory distress.     Breath sounds: Normal breath sounds. No wheezing (exp wheezes throughout) or rales.  Chest:     Chest wall: No tenderness.  Abdominal:     General: Bowel sounds are normal. There is no distension or abdominal bruit.     Palpations: Abdomen is soft. There is no hepatomegaly, splenomegaly, mass or pulsatile mass.     Tenderness: There is no abdominal tenderness.  Musculoskeletal:        General: Normal range of motion.     Cervical back: Normal range of motion and neck supple.  Lymphadenopathy:     Cervical: No cervical adenopathy.  Skin:    General: Skin is warm and dry.  Neurological:     Mental Status: She is alert and oriented to person, place, and time.     Deep Tendon Reflexes: Reflexes are normal and symmetric.  Psychiatric:        Behavior: Behavior normal.        Thought Content: Thought content normal.        Judgment: Judgment normal.    BP 103/72   Pulse 92   Temp 97.6 F (36.4 C) (Temporal)   Ht 5' 6 (1.676 m)   Wt 171 lb (77.6 kg)   SpO2 99%   BMI 27.60 kg/m        Assessment & Plan:  Lynn Golden comes in today with chief complaint of annual physical   Diagnosis and orders addressed:  1. Primary hypertension Low sodium diet - lisinopril   (ZESTRIL ) 40 MG tablet; TAKE ONE (1) TABLET BY MOUTH EVERY DAY  Dispense: 90 tablet; Refill: 1 - CBC with Differential/Platelet - CMP14+EGFR - Lipid panel  2. Acquired hypothyroidism Labs pending - levothyroxine  (SYNTHROID ) 100 MCG tablet; Take 1 tablet (100 mcg total) by mouth daily.  Dispense: 90 tablet; Refill: 1 - Thyroid  Panel With TSH  3. COPD (chronic obstructive pulmonary disease) with acute bronchitis (HCC) Smoking cessation encouraged - fluticasone -salmeterol (ADVAIR ) 100-50 MCG/ACT AEPB; Inhale 1 puff into the lungs 2 (two) times daily.  Dispense: 180 each; Refill: 1  4. GERD without esophagitis Avoid spicy foods Do not eat 2 hours prior to bedtime  - pantoprazole  (PROTONIX ) 40 MG tablet; Take 1 tablet (40 mg total) by mouth daily.  Dispense: 90 tablet; Refill: 1  5. Recurrent major depressive disorder, in full remission (HCC) Stress management Increased celexa  to 40mg  daily - citalopram  (CELEXA ) 40 MG tablet; Take 1 tablet (40 mg total) by mouth daily.  Dispense: 90 tablet; Refill: 1  6. Primary insomnia Bedtime routine - traZODone  (DESYREL ) 100 MG tablet; Take 1 tablet (100 mg total) by mouth at bedtime.  Dispense: 90 tablet; Refill: 1  7. BMI 31.0-31.9,adult Discussed diet and exercise for person with BMI >25 Will recheck weight in 3-6 months   8. Body aches - gabapentin  (NEURONTIN ) 100 MG capsule; Take 1 capsule (100 mg total) by mouth 2 (two) times daily.  Dispense: 180 capsule; Refill: 0   Labs pending Health Maintenance reviewed Diet and exercise encouraged  Follow up plan: 6 months   Mary-Margaret Gladis, FNP

## 2024-05-19 ENCOUNTER — Ambulatory Visit: Payer: Self-pay | Admitting: Nurse Practitioner

## 2024-05-19 DIAGNOSIS — N1831 Chronic kidney disease, stage 3a: Secondary | ICD-10-CM | POA: Insufficient documentation

## 2024-05-19 LAB — CMP14+EGFR
ALT: 19 IU/L (ref 0–32)
AST: 17 IU/L (ref 0–40)
Albumin: 4.3 g/dL (ref 3.8–4.9)
Alkaline Phosphatase: 90 IU/L (ref 49–135)
BUN/Creatinine Ratio: 9 (ref 9–23)
BUN: 10 mg/dL (ref 6–24)
Bilirubin Total: 0.3 mg/dL (ref 0.0–1.2)
CO2: 24 mmol/L (ref 20–29)
Calcium: 9.5 mg/dL (ref 8.7–10.2)
Chloride: 98 mmol/L (ref 96–106)
Creatinine, Ser: 1.16 mg/dL — ABNORMAL HIGH (ref 0.57–1.00)
Globulin, Total: 2 g/dL (ref 1.5–4.5)
Glucose: 73 mg/dL (ref 70–99)
Potassium: 4.4 mmol/L (ref 3.5–5.2)
Sodium: 137 mmol/L (ref 134–144)
Total Protein: 6.3 g/dL (ref 6.0–8.5)
eGFR: 57 mL/min/1.73 — ABNORMAL LOW (ref >59)

## 2024-05-19 LAB — CBC WITH DIFFERENTIAL/PLATELET
Basophils Absolute: 0.1 x10E3/uL (ref 0.0–0.2)
Basos: 1 %
EOS (ABSOLUTE): 0.1 x10E3/uL (ref 0.0–0.4)
Eos: 2 %
Hematocrit: 47.3 % — ABNORMAL HIGH (ref 34.0–46.6)
Hemoglobin: 16.1 g/dL — ABNORMAL HIGH (ref 11.1–15.9)
Immature Grans (Abs): 0 x10E3/uL (ref 0.0–0.1)
Immature Granulocytes: 0 %
Lymphocytes Absolute: 1.4 x10E3/uL (ref 0.7–3.1)
Lymphs: 24 %
MCH: 33.7 pg — ABNORMAL HIGH (ref 26.6–33.0)
MCHC: 34 g/dL (ref 31.5–35.7)
MCV: 99 fL — ABNORMAL HIGH (ref 79–97)
Monocytes Absolute: 0.5 x10E3/uL (ref 0.1–0.9)
Monocytes: 9 %
Neutrophils Absolute: 3.6 x10E3/uL (ref 1.4–7.0)
Neutrophils: 64 %
Platelets: 350 x10E3/uL (ref 150–450)
RBC: 4.78 x10E6/uL (ref 3.77–5.28)
RDW: 12.2 % (ref 11.7–15.4)
WBC: 5.7 x10E3/uL (ref 3.4–10.8)

## 2024-05-19 LAB — THYROID PANEL WITH TSH
Free Thyroxine Index: 1.5 (ref 1.2–4.9)
T3 Uptake Ratio: 27 % (ref 24–39)
T4, Total: 5.7 ug/dL (ref 4.5–12.0)
TSH: 2.98 u[IU]/mL (ref 0.450–4.500)

## 2024-05-19 LAB — LIPID PANEL
Chol/HDL Ratio: 2.4 ratio (ref 0.0–4.4)
Cholesterol, Total: 176 mg/dL (ref 100–199)
HDL: 74 mg/dL (ref >39)
LDL Chol Calc (NIH): 83 mg/dL (ref 0–99)
Triglycerides: 111 mg/dL (ref 0–149)
VLDL Cholesterol Cal: 19 mg/dL (ref 5–40)

## 2024-05-19 LAB — VITAMIN D 25 HYDROXY (VIT D DEFICIENCY, FRACTURES): Vit D, 25-Hydroxy: 26.6 ng/mL — ABNORMAL LOW (ref 30.0–100.0)

## 2024-08-22 ENCOUNTER — Other Ambulatory Visit: Payer: Self-pay | Admitting: Nurse Practitioner

## 2024-08-22 DIAGNOSIS — E039 Hypothyroidism, unspecified: Secondary | ICD-10-CM

## 2024-08-31 ENCOUNTER — Other Ambulatory Visit: Payer: Self-pay | Admitting: Nurse Practitioner

## 2024-08-31 DIAGNOSIS — F3342 Major depressive disorder, recurrent, in full remission: Secondary | ICD-10-CM

## 2024-11-16 ENCOUNTER — Ambulatory Visit: Payer: Self-pay | Admitting: Nurse Practitioner
